# Patient Record
Sex: Female | Born: 1955 | Race: White | Hispanic: No | Marital: Married | Smoking: Former smoker
Health system: Southern US, Community
[De-identification: ages and names within clinical notes are randomized; demographics above are authoritative.]

## PROBLEM LIST (undated history)

## (undated) DIAGNOSIS — Z91030 Bee allergy status: Secondary | ICD-10-CM

## (undated) DIAGNOSIS — K219 Gastro-esophageal reflux disease without esophagitis: Secondary | ICD-10-CM

## (undated) DIAGNOSIS — G43909 Migraine, unspecified, not intractable, without status migrainosus: Secondary | ICD-10-CM

## (undated) DIAGNOSIS — K2270 Barrett's esophagus without dysplasia: Secondary | ICD-10-CM

## (undated) DIAGNOSIS — K5909 Other constipation: Secondary | ICD-10-CM

## (undated) DIAGNOSIS — K589 Irritable bowel syndrome without diarrhea: Secondary | ICD-10-CM

## (undated) DIAGNOSIS — F988 Other specified behavioral and emotional disorders with onset usually occurring in childhood and adolescence: Secondary | ICD-10-CM

## (undated) DIAGNOSIS — F32A Depression, unspecified: Secondary | ICD-10-CM

## (undated) DIAGNOSIS — D649 Anemia, unspecified: Secondary | ICD-10-CM

## (undated) DIAGNOSIS — Z221 Carrier of other intestinal infectious diseases: Secondary | ICD-10-CM

## (undated) DIAGNOSIS — M1990 Unspecified osteoarthritis, unspecified site: Secondary | ICD-10-CM

## (undated) DIAGNOSIS — F329 Major depressive disorder, single episode, unspecified: Secondary | ICD-10-CM

## (undated) DIAGNOSIS — A6000 Herpesviral infection of urogenital system, unspecified: Secondary | ICD-10-CM

## (undated) DIAGNOSIS — I10 Essential (primary) hypertension: Secondary | ICD-10-CM

## (undated) DIAGNOSIS — R5383 Other fatigue: Secondary | ICD-10-CM

## (undated) HISTORY — PX: PARTIAL HYSTERECTOMY: SHX80

## (undated) HISTORY — DX: Barrett's esophagus without dysplasia: K22.70

## (undated) HISTORY — DX: Herpesviral infection of urogenital system, unspecified: A60.00

## (undated) HISTORY — DX: Gastro-esophageal reflux disease without esophagitis: K21.9

## (undated) HISTORY — DX: Bee allergy status: Z91.030

## (undated) HISTORY — DX: Carrier of other intestinal infectious diseases: Z22.1

## (undated) HISTORY — DX: Other specified behavioral and emotional disorders with onset usually occurring in childhood and adolescence: F98.8

## (undated) HISTORY — DX: Migraine, unspecified, not intractable, without status migrainosus: G43.909

## (undated) HISTORY — DX: Anemia, unspecified: D64.9

## (undated) HISTORY — DX: Other constipation: K59.09

## (undated) HISTORY — DX: Depression, unspecified: F32.A

## (undated) HISTORY — DX: Other fatigue: R53.83

## (undated) HISTORY — DX: Essential (primary) hypertension: I10

## (undated) HISTORY — PX: HERNIA REPAIR: SHX51

## (undated) HISTORY — PX: ESOPHAGOGASTRODUODENOSCOPY ENDOSCOPY: SHX5814

## (undated) HISTORY — DX: Unspecified osteoarthritis, unspecified site: M19.90

## (undated) HISTORY — DX: Irritable bowel syndrome, unspecified: K58.9

---
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
## 1997-06-28 ENCOUNTER — Ambulatory Visit (HOSPITAL_COMMUNITY): Admission: RE | Admit: 1997-06-28 | Discharge: 1997-06-28 | Payer: Self-pay | Admitting: General Surgery

## 1997-11-22 ENCOUNTER — Other Ambulatory Visit: Admission: RE | Admit: 1997-11-22 | Discharge: 1997-11-22 | Payer: Self-pay | Admitting: Obstetrics and Gynecology

## 1997-12-06 ENCOUNTER — Inpatient Hospital Stay (HOSPITAL_COMMUNITY): Admission: RE | Admit: 1997-12-06 | Discharge: 1997-12-07 | Payer: Self-pay | Admitting: Obstetrics and Gynecology

## 1998-01-19 ENCOUNTER — Ambulatory Visit (HOSPITAL_COMMUNITY): Admission: RE | Admit: 1998-01-19 | Discharge: 1998-01-19 | Payer: Self-pay | Admitting: Obstetrics and Gynecology

## 1998-07-11 ENCOUNTER — Ambulatory Visit (HOSPITAL_COMMUNITY): Admission: RE | Admit: 1998-07-11 | Discharge: 1998-07-11 | Payer: Self-pay | Admitting: Obstetrics and Gynecology

## 1998-07-11 ENCOUNTER — Encounter: Payer: Self-pay | Admitting: Obstetrics and Gynecology

## 1998-12-28 ENCOUNTER — Other Ambulatory Visit: Admission: RE | Admit: 1998-12-28 | Discharge: 1998-12-28 | Payer: Self-pay | Admitting: Obstetrics and Gynecology

## 1999-06-07 ENCOUNTER — Encounter: Payer: Self-pay | Admitting: Obstetrics and Gynecology

## 1999-06-07 ENCOUNTER — Ambulatory Visit (HOSPITAL_COMMUNITY): Admission: RE | Admit: 1999-06-07 | Discharge: 1999-06-07 | Payer: Self-pay | Admitting: Obstetrics and Gynecology

## 2000-01-29 ENCOUNTER — Other Ambulatory Visit: Admission: RE | Admit: 2000-01-29 | Discharge: 2000-01-29 | Payer: Self-pay | Admitting: Obstetrics and Gynecology

## 2000-06-18 ENCOUNTER — Encounter: Payer: Self-pay | Admitting: Obstetrics and Gynecology

## 2000-06-18 ENCOUNTER — Ambulatory Visit (HOSPITAL_COMMUNITY): Admission: RE | Admit: 2000-06-18 | Discharge: 2000-06-18 | Payer: Self-pay | Admitting: Obstetrics and Gynecology

## 2001-02-24 ENCOUNTER — Other Ambulatory Visit: Admission: RE | Admit: 2001-02-24 | Discharge: 2001-02-24 | Payer: Self-pay | Admitting: Obstetrics and Gynecology

## 2002-05-12 ENCOUNTER — Ambulatory Visit (HOSPITAL_COMMUNITY): Admission: RE | Admit: 2002-05-12 | Discharge: 2002-05-12 | Payer: Self-pay | Admitting: Gastroenterology

## 2003-05-10 ENCOUNTER — Ambulatory Visit (HOSPITAL_COMMUNITY): Admission: RE | Admit: 2003-05-10 | Discharge: 2003-05-10 | Payer: Self-pay | Admitting: Family Medicine

## 2004-06-12 ENCOUNTER — Ambulatory Visit (HOSPITAL_COMMUNITY): Admission: RE | Admit: 2004-06-12 | Discharge: 2004-06-12 | Payer: Self-pay | Admitting: Family Medicine

## 2005-06-17 ENCOUNTER — Encounter: Admission: RE | Admit: 2005-06-17 | Discharge: 2005-06-17 | Payer: Self-pay | Admitting: Family Medicine

## 2006-07-15 ENCOUNTER — Encounter: Admission: RE | Admit: 2006-07-15 | Discharge: 2006-07-15 | Payer: Self-pay | Admitting: Family Medicine

## 2012-07-14 ENCOUNTER — Telehealth: Payer: Self-pay | Admitting: *Deleted

## 2012-07-14 NOTE — Telephone Encounter (Signed)
PT WANTED TO KNOW WHAT SHE SHOULD DO SHE NEEDS RX. REFILL, BUT WAS NOT SURE IF SHE NEEDED APPT FOR FU OR LABS. SAID ITS BEEN A WHILE. WANTED TO GET A FOR SURE FROM DR. ZANARD AS TO WHAT TO DO.

## 2012-07-15 NOTE — Telephone Encounter (Signed)
Yes she needs labwork.  You can bring her one of these mornings for labs and a week later to see Dr Claudia Mitchell.  Thanks PG

## 2012-07-17 NOTE — Telephone Encounter (Signed)
LAB APPT SCHED.

## 2012-07-17 NOTE — Addendum Note (Signed)
Addended by: Clint Bolder D on: 07/17/2012 05:13 PM   Modules accepted: Orders

## 2012-07-20 ENCOUNTER — Other Ambulatory Visit: Payer: Self-pay

## 2012-09-10 ENCOUNTER — Other Ambulatory Visit: Payer: Self-pay | Admitting: Family Medicine

## 2012-09-10 ENCOUNTER — Other Ambulatory Visit: Payer: Self-pay

## 2012-09-10 LAB — CBC WITH DIFFERENTIAL/PLATELET
Basophils Absolute: 0.1 10*3/uL (ref 0.0–0.1)
Basophils Relative: 1 % (ref 0–1)
Eosinophils Absolute: 0.5 10*3/uL (ref 0.0–0.7)
Eosinophils Relative: 10 % — ABNORMAL HIGH (ref 0–5)
HCT: 39.9 % (ref 36.0–46.0)
Hemoglobin: 13.2 g/dL (ref 12.0–15.0)
Lymphocytes Relative: 26 % (ref 12–46)
Lymphs Abs: 1.3 10*3/uL (ref 0.7–4.0)
MCH: 30.1 pg (ref 26.0–34.0)
MCHC: 33.1 g/dL (ref 30.0–36.0)
MCV: 90.9 fL (ref 78.0–100.0)
Monocytes Absolute: 0.5 10*3/uL (ref 0.1–1.0)
Monocytes Relative: 9 % (ref 3–12)
Neutro Abs: 2.8 10*3/uL (ref 1.7–7.7)
Neutrophils Relative %: 54 % (ref 43–77)
Platelets: 309 10*3/uL (ref 150–400)
RBC: 4.39 MIL/uL (ref 3.87–5.11)
RDW: 12.9 % (ref 11.5–15.5)
WBC: 5.1 10*3/uL (ref 4.0–10.5)

## 2012-09-10 LAB — COMPLETE METABOLIC PANEL WITH GFR
ALT: 16 U/L (ref 0–35)
AST: 17 U/L (ref 0–37)
Albumin: 4.2 g/dL (ref 3.5–5.2)
Alkaline Phosphatase: 36 U/L — ABNORMAL LOW (ref 39–117)
BUN: 17 mg/dL (ref 6–23)
CO2: 23 mEq/L (ref 19–32)
Calcium: 9.2 mg/dL (ref 8.4–10.5)
Chloride: 107 mEq/L (ref 96–112)
Creat: 0.96 mg/dL (ref 0.50–1.10)
GFR, Est African American: 76 mL/min
GFR, Est Non African American: 66 mL/min
Glucose, Bld: 102 mg/dL — ABNORMAL HIGH (ref 70–99)
Potassium: 4.2 mEq/L (ref 3.5–5.3)
Sodium: 139 mEq/L (ref 135–145)
Total Bilirubin: 0.3 mg/dL (ref 0.3–1.2)
Total Protein: 6.4 g/dL (ref 6.0–8.3)

## 2012-09-10 LAB — LIPID PANEL
Cholesterol: 186 mg/dL (ref 0–200)
HDL: 48 mg/dL (ref >39)
LDL Cholesterol: 122 mg/dL — ABNORMAL HIGH (ref 0–99)
Total CHOL/HDL Ratio: 3.9 Ratio
Triglycerides: 78 mg/dL (ref <150)
VLDL: 16 mg/dL (ref 0–40)

## 2012-09-10 LAB — TESTOSTERONE: Testosterone: 20 ng/dL (ref 10–70)

## 2012-09-10 LAB — URIC ACID: Uric Acid, Serum: 4 mg/dL (ref 2.4–7.0)

## 2012-09-10 LAB — ESTRADIOL: Estradiol: 65 pg/mL

## 2012-09-10 LAB — PROGESTERONE: Progesterone: 2.4 ng/mL

## 2012-09-11 LAB — VITAMIN D 25 HYDROXY (VIT D DEFICIENCY, FRACTURES): Vit D, 25-Hydroxy: 63 ng/mL (ref 30–89)

## 2012-09-17 ENCOUNTER — Telehealth: Payer: Self-pay | Admitting: Family Medicine

## 2012-09-17 ENCOUNTER — Encounter: Payer: Self-pay | Admitting: Family Medicine

## 2012-09-17 ENCOUNTER — Encounter: Payer: Self-pay | Admitting: *Deleted

## 2012-09-17 ENCOUNTER — Ambulatory Visit (INDEPENDENT_AMBULATORY_CARE_PROVIDER_SITE_OTHER): Admitting: Family Medicine

## 2012-09-17 VITALS — BP 116/69 | HR 69 | Ht 60.0 in | Wt 138.0 lb

## 2012-09-17 DIAGNOSIS — G43809 Other migraine, not intractable, without status migrainosus: Secondary | ICD-10-CM

## 2012-09-17 DIAGNOSIS — M542 Cervicalgia: Secondary | ICD-10-CM

## 2012-09-17 DIAGNOSIS — K219 Gastro-esophageal reflux disease without esophagitis: Secondary | ICD-10-CM

## 2012-09-17 DIAGNOSIS — N958 Other specified menopausal and perimenopausal disorders: Secondary | ICD-10-CM

## 2012-09-17 DIAGNOSIS — I10 Essential (primary) hypertension: Secondary | ICD-10-CM

## 2012-09-17 DIAGNOSIS — F411 Generalized anxiety disorder: Secondary | ICD-10-CM

## 2012-09-17 DIAGNOSIS — E785 Hyperlipidemia, unspecified: Secondary | ICD-10-CM

## 2012-09-17 MED ORDER — NEBIVOLOL HCL 5 MG PO TABS: 5.0000 mg | ORAL_TABLET | Freq: Every day | ORAL | Status: DC

## 2012-09-17 MED ORDER — ALMOTRIPTAN MALATE 12.5 MG PO TABS: 12.5000 mg | ORAL_TABLET | ORAL | Status: DC | PRN

## 2012-09-17 MED ORDER — PROGESTERONE MICRONIZED 100 MG PO CAPS: 100.0000 mg | ORAL_CAPSULE | Freq: Every day | ORAL | Status: DC

## 2012-09-17 MED ORDER — IBUPROFEN 600 MG PO TABS: 600.0000 mg | ORAL_TABLET | Freq: Three times a day (TID) | ORAL | Status: DC | PRN

## 2012-09-17 MED ORDER — FLUOXETINE HCL 20 MG PO CAPS: 20.0000 mg | ORAL_CAPSULE | Freq: Every day | ORAL | Status: DC

## 2012-09-17 MED ORDER — PANTOPRAZOLE SODIUM 40 MG PO TBEC: 40.0000 mg | DELAYED_RELEASE_TABLET | Freq: Every day | ORAL | Status: DC

## 2012-09-17 MED ORDER — BUPROPION HCL ER (XL) 300 MG PO TB24: 300.0000 mg | ORAL_TABLET | ORAL | Status: DC

## 2012-09-17 MED ORDER — TOPIRAMATE 100 MG PO TABS: 100.0000 mg | ORAL_TABLET | Freq: Every day | ORAL | Status: DC

## 2012-09-17 MED ORDER — MAGNESIUM GLUCONATE 500 MG PO TABS: 500.0000 mg | ORAL_TABLET | Freq: Every day | ORAL | Status: DC

## 2012-09-17 NOTE — Patient Instructions (Addendum)
1)  Blood Sugar - Your level was a little high so you want to watch your sugar/carb intake.  Increase your exercise.  Limit artificial sweeteners.  Cinnamon 1000 mg and Chromium 1000 mcg help with sugar issues (lower sugar, decrease insulin resistance).    2)  Headaches - Dr. Neale Burly ([facility])

## 2012-09-20 LAB — ESTRONE: Estrone: 27 pg/mL

## 2012-09-24 ENCOUNTER — Encounter: Payer: Self-pay | Admitting: Family Medicine

## 2012-09-24 DIAGNOSIS — I10 Essential (primary) hypertension: Secondary | ICD-10-CM | POA: Insufficient documentation

## 2012-09-24 DIAGNOSIS — K219 Gastro-esophageal reflux disease without esophagitis: Secondary | ICD-10-CM | POA: Insufficient documentation

## 2012-09-24 DIAGNOSIS — F411 Generalized anxiety disorder: Secondary | ICD-10-CM | POA: Insufficient documentation

## 2012-09-24 DIAGNOSIS — G43809 Other migraine, not intractable, without status migrainosus: Secondary | ICD-10-CM | POA: Insufficient documentation

## 2012-09-24 NOTE — Progress Notes (Signed)
  Subjective:    Patient ID: Claudia Mitchell, female    DOB: Sep 09, 1955, 57 y.o.   MRN: [id number]  HPI  Claudia Mitchell is here today to go over her most recent lab results, discuss the issues below and to get refills on many of her medications.    1)  Migraine Headaches:  She has been having increased migraine headaches lately.    2) Hypertension: Her BP is well controlled on the Bystolic.  3) GERD: She is taking and doing fine on Protonix.   4) Anxiety:  Her mood is fine on the combination of Wellbutrin and Prozac.    5) HRT:  Her menopausal symptoms are controlled with the Femring and Prometrium.    Review of Systems  Constitutional: Negative for activity change, fatigue and unexpected weight change.  Eyes: Negative.   Respiratory: Negative for shortness of breath.   Cardiovascular: Negative for chest pain, palpitations and leg swelling.  Gastrointestinal: Negative for diarrhea and constipation.  Endocrine: Negative.   Genitourinary: Negative for difficulty urinating.  Musculoskeletal: Negative.        Left neck/shoulder pain   Skin: Negative.   Neurological: Positive for headaches.  Hematological: Negative for adenopathy. Does not bruise/bleed easily.  Psychiatric/Behavioral: Negative for sleep disturbance and dysphoric mood. The patient is not nervous/anxious.     Past Medical History  Diagnosis Date  . Hypertension   . Migraine headache   . Chronic constipation   . Clostridium difficile carrier   . Anemia    Family History  Problem Relation Age of Onset  . Hypertension Mother   . Cancer Father    History   Social History Narrative   Marital Status: Married Ree Kida)   Children:  G 4 P 3013   Pets:  None    Living Situation: Lives with spouse and mother.     Occupation: Psychologist, clinical   Alcohol Use:  Occasional    Drug Use:  None   Tobacco Use:  Never    Diet:  Regular   Exercise:  Limited    Hobbies: Cooking      Objective:    Physical Exam  Constitutional: She appears well-nourished. No distress.  HENT:  Head: Normocephalic.  Eyes: No scleral icterus.  Neck: No thyromegaly present.  Tight muscles   Cardiovascular: Normal rate, regular rhythm and normal heart sounds.   Pulmonary/Chest: Effort normal and breath sounds normal.  Abdominal: There is no tenderness.  Musculoskeletal: She exhibits no edema and no tenderness.  Neurological: She is alert.  Skin: Skin is warm and dry.  Psychiatric: She has a normal mood and affect. Her behavior is normal. Judgment and thought content normal.      Assessment & Plan:

## 2012-10-05 ENCOUNTER — Ambulatory Visit (INDEPENDENT_AMBULATORY_CARE_PROVIDER_SITE_OTHER): Admitting: Family Medicine

## 2012-10-05 ENCOUNTER — Other Ambulatory Visit: Payer: Self-pay | Admitting: Family Medicine

## 2012-10-05 ENCOUNTER — Encounter: Payer: Self-pay | Admitting: Family Medicine

## 2012-10-05 ENCOUNTER — Ambulatory Visit
Admission: RE | Admit: 2012-10-05 | Discharge: 2012-10-05 | Disposition: A | Discharge status: Home / Self Care | Payer: 59 | Source: Ambulatory Visit | Attending: Family Medicine | Admitting: Family Medicine

## 2012-10-05 VITALS — BP 129/84 | HR 67 | Wt 138.0 lb

## 2012-10-05 DIAGNOSIS — M542 Cervicalgia: Secondary | ICD-10-CM | POA: Insufficient documentation

## 2012-10-05 DIAGNOSIS — N958 Other specified menopausal and perimenopausal disorders: Secondary | ICD-10-CM | POA: Insufficient documentation

## 2012-10-05 DIAGNOSIS — M79602 Pain in left arm: Secondary | ICD-10-CM

## 2012-10-05 DIAGNOSIS — E785 Hyperlipidemia, unspecified: Secondary | ICD-10-CM | POA: Insufficient documentation

## 2012-10-05 MED ORDER — KETOROLAC TROMETHAMINE 60 MG/2ML IM SOLN
60.0000 mg | Freq: Once | INTRAMUSCULAR | Status: AC
  Administered 2012-10-05: 60 mg via INTRAMUSCULAR

## 2012-10-05 MED ORDER — METHYLPREDNISOLONE (PAK) 4 MG PO TABS: 4.0000 mg | ORAL_TABLET | Freq: Every day | ORAL | Status: DC

## 2012-10-05 MED ORDER — METHYLPREDNISOLONE SODIUM SUCC 125 MG IJ SOLR
125.0000 mg | Freq: Once | INTRAMUSCULAR | Status: AC
  Administered 2012-10-05: 125 mg via INTRAMUSCULAR

## 2012-10-05 NOTE — Assessment & Plan Note (Signed)
Refilled her HRT.

## 2012-10-05 NOTE — Assessment & Plan Note (Signed)
She has been seeing her chiropractor.  Her pain seems to be getting slowly better.

## 2012-10-05 NOTE — Assessment & Plan Note (Signed)
We discussed possible treatments for her including Botox.  She really likes her mother's neurologist in [place] and will see if he does this procedure.  If so then she will follow up with him.

## 2012-10-05 NOTE — Assessment & Plan Note (Addendum)
Sent for a MRI of her cervical spine which shows moderate compression/impingement.  We'll send the report to a neurologist to see if he feels she can be treated with injections vs surgery.  She received injections of Solu-Medrol and Toradol at today's visit and will start on the Medrol Dose Pak tomorrow.

## 2012-10-05 NOTE — Assessment & Plan Note (Signed)
Refilled her Wellbutrin and Prozac.

## 2012-10-05 NOTE — Progress Notes (Signed)
Case # [id number]. Approval # [id number] for patient to have MRI. Good from 10/05/12 until 12/04/12. [facility] has been notified and given the information above at this time. LB

## 2012-10-05 NOTE — Patient Instructions (Signed)

## 2012-10-05 NOTE — Assessment & Plan Note (Signed)
She will remain on Bystolic.

## 2012-10-05 NOTE — Progress Notes (Signed)
  Subjective:    Patient ID: Claudia Mitchell, female    DOB: 03-10-1956, 57 y.o.   MRN: [id number]   Claudia Mitchell is here today with severe neck/shoulder pain.  She has been getting chiropractic adjustments which have helped her in the past but are not helping now. She would like to be referred to a neurologist.    Neck Pain  This is a chronic problem. The problem occurs constantly. The problem has been rapidly worsening. The pain is present in the left side. The quality of the pain is described as stabbing and aching. The pain is at a severity of 10/10. The pain is severe. The symptoms are aggravated by position and twisting. The pain is worse during the night. Associated symptoms include chest pain and headaches. Pertinent negatives include no fever. She has tried NSAIDs, acetaminophen, bed rest, ice and chiropractic manipulation for the symptoms. The treatment provided no relief.    Review of Systems  Constitutional: Negative for fever.  HENT: Positive for neck pain.   Respiratory: Negative for cough, chest tightness and shortness of breath.   Cardiovascular: Positive for chest pain.  Neurological: Positive for headaches.       Objective:   Physical Exam  Constitutional: She appears distressed.  Neck: Neck supple.  Cardiovascular: Normal rate, regular rhythm and normal heart sounds.   Pulmonary/Chest: Effort normal and breath sounds normal.  Musculoskeletal: Normal range of motion. She exhibits tenderness (Neck/Shoulder Pain ).  Neurological: She exhibits normal muscle tone. Coordination normal.  Skin: Skin is warm and dry. No rash noted.  Psychiatric: She has a normal mood and affect. Her behavior is normal. Judgment and thought content normal.          Assessment & Plan:

## 2012-10-05 NOTE — Assessment & Plan Note (Signed)
She will work harder on diet and exercise.

## 2012-10-05 NOTE — Assessment & Plan Note (Signed)
Refilled Protonix 

## 2012-10-05 NOTE — Progress Notes (Deleted)
                                                                                                                                                                                                                                              Subjective:    Patient ID: Claudia Mitchell, female    DOB: Aug 22, 1955, 57 y.o.   MRN: [id number]  HPI    Review of Systems     Objective:   Physical Exam        Assessment & Plan:

## 2012-10-12 NOTE — Telephone Encounter (Signed)
done

## 2012-11-23 ENCOUNTER — Ambulatory Visit (INDEPENDENT_AMBULATORY_CARE_PROVIDER_SITE_OTHER): Payer: 59 | Admitting: Family Medicine

## 2012-11-23 VITALS — BP 120/82 | HR 63 | Resp 16 | Ht 60.0 in | Wt 136.0 lb

## 2012-11-23 DIAGNOSIS — B009 Herpesviral infection, unspecified: Secondary | ICD-10-CM

## 2012-11-23 MED ORDER — CLOBETASOL PROPIONATE 0.05 % EX CREA: TOPICAL_CREAM | Freq: Two times a day (BID) | CUTANEOUS | Status: DC

## 2012-11-23 NOTE — Progress Notes (Signed)
  Subjective:    Patient ID: Claudia Mitchell, female    DOB: 1956/02/14, 57 y.o.   MRN: [id number]  HPI  [name] is here today complaining of vaginal irritation. She feels very raw and thinks she might have a blister.  Her skin has been itching and burning.     Review of Systems  Constitutional: Negative for fever.  Genitourinary: Positive for genital sores. Negative for vaginal discharge.       Vaginal itching, burning and blistering   Skin: Positive for rash.        Objective:   Physical Exam  Constitutional: She appears well-nourished.  Uncomfortable   Cardiovascular: Normal rate and regular rhythm.   Pulmonary/Chest: Effort normal and breath sounds normal.  Genitourinary: Vagina normal. No vaginal discharge found.  Genital lesion that looks like a herpes lesion   Neurological: She is alert.  Skin: Skin is warm.  Psychiatric: She has a normal mood and affect.       Assessment & Plan:

## 2012-11-23 NOTE — Patient Instructions (Addendum)
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  If you still itch then apply the clobetasol.  You can also cover with Flander's Buttock Ointment.      Genital Herpes Genital herpes is a sexually transmitted disease. This means that it is a disease passed by having sex with an infected person. There is no cure for genital herpes. The time between attacks can be months to years. The virus may live in a person but produce no problems (symptoms). This infection can be passed to a baby as it travels down the birth canal (vagina). In a newborn, this can cause central nervous system damage, eye damage, or even death. The virus that causes genital herpes is usually HSV-2 virus. The virus that causes oral herpes is usually HSV-1. The diagnosis (learning what is wrong) is made through culture results. SYMPTOMS  Usually symptoms of pain and itching begin a few days to a week after contact. It first appears as small blisters that progress to small painful ulcers which then scab over and heal after several days. It affects the outer genitalia, birth canal, cervix, penis, anal area, buttocks, and thighs. HOME CARE INSTRUCTIONS   Keep ulcerated areas dry and clean.  Take medications as directed. Antiviral medications can speed up healing. They will not prevent recurrences or cure this infection. These medications can also be taken for suppression if there are frequent recurrences.  While the infection is active, it is contagious. Avoid all sexual contact during active infections.  Condoms may help prevent spread of the herpes virus.  Practice safe sex.  Wash your hands thoroughly after touching the genital area.  Avoid touching your eyes after touching your genital area.  Inform your caregiver if you have had genital herpes and become pregnant. It is your responsibility to insure a safe outcome for your baby in this pregnancy.  Only take over-the-counter or prescription  medicines for pain, discomfort, or fever as directed by your caregiver. SEEK MEDICAL CARE IF:   You have a recurrence of this infection.  You do not respond to medications and are not improving.  You have new sources of pain or discharge which have changed from the original infection.  You have an oral temperature above 102 F (38.9 C).  You develop abdominal pain.  You develop eye pain or signs of eye infection. Document Released: 04/05/2000 Document Revised: 07/01/2011 Document Reviewed: 04/26/2009 [facility] Patient Information 2014 [place], [place]. Genital Herpes Genital herpes is a sexually transmitted disease. This means that it is a disease passed by having sex with an infected person. There is no cure for genital herpes. The time between attacks can be months to years. The virus may live in a person but produce no problems (symptoms). This infection can be passed to a baby as it travels down the birth canal (vagina). In a newborn, this can cause central nervous system damage, eye damage, or even death. The virus that causes genital herpes is usually HSV-2 virus. The virus that causes oral herpes is usually HSV-1. The diagnosis (learning what is wrong) is made through culture results. SYMPTOMS  Usually symptoms of pain and itching begin a few days to a week after contact. It first appears as small blisters that progress to small painful ulcers which then scab over and heal after several days. It affects the outer genitalia, birth canal, cervix, penis, anal area, buttocks, and thighs. HOME CARE INSTRUCTIONS   Keep  ulcerated areas dry and clean.  Take medications as directed. Antiviral medications can speed up healing. They will not prevent recurrences or cure this infection. These medications can also be taken for suppression if there are frequent recurrences.  While the infection is active, it is contagious. Avoid all sexual contact during active infections.  Condoms may help  prevent spread of the herpes virus.  Practice safe sex.  Wash your hands thoroughly after touching the genital area.  Avoid touching your eyes after touching your genital area.  Inform your caregiver if you have had genital herpes and become pregnant. It is your responsibility to insure a safe outcome for your baby in this pregnancy.  Only take over-the-counter or prescription medicines for pain, discomfort, or fever as directed by your caregiver. SEEK MEDICAL CARE IF:   You have a recurrence of this infection.  You do not respond to medications and are not improving.  You have new sources of pain or discharge which have changed from the original infection.  You have an oral temperature above 102 F (38.9 C).  You develop abdominal pain.  You develop eye pain or signs of eye infection. Document Released: 04/05/2000 Document Revised: 07/01/2011 Document Reviewed: 04/26/2009 [facility] Patient Information 2014 [place], [place]. Genital Herpes Genital herpes is a sexually transmitted disease. This means that it is a disease passed by having sex with an infected person. There is no cure for genital herpes. The time between attacks can be months to years. The virus may live in a person but produce no problems (symptoms). This infection can be passed to a baby as it travels down the birth canal (vagina). In a newborn, this can cause central nervous system damage, eye damage, or even death. The virus that causes genital herpes is usually HSV-2 virus. The virus that causes oral herpes is usually HSV-1. The diagnosis (learning what is wrong) is made through culture results. SYMPTOMS  Usually symptoms of pain and itching begin a few days to a week after contact. It first appears as small blisters that progress to small painful ulcers which then scab over and heal after several days. It affects the outer genitalia, birth canal, cervix, penis, anal area, buttocks, and thighs. HOME CARE INSTRUCTIONS    Keep ulcerated areas dry and clean.  Take medications as directed. Antiviral medications can speed up healing. They will not prevent recurrences or cure this infection. These medications can also be taken for suppression if there are frequent recurrences.  While the infection is active, it is contagious. Avoid all sexual contact during active infections.  Condoms may help prevent spread of the herpes virus.  Practice safe sex.  Wash your hands thoroughly after touching the genital area.  Avoid touching your eyes after touching your genital area.  Inform your caregiver if you have had genital herpes and become pregnant. It is your responsibility to insure a safe outcome for your baby in this pregnancy.  Only take over-the-counter or prescription medicines for pain, discomfort, or fever as directed by your caregiver. SEEK MEDICAL CARE IF:   You have a recurrence of this infection.  You do not respond to medications and are not improving.  You have new sources of pain or discharge which have changed from the original infection.  You have an oral temperature above 102 F (38.9 C).  You develop abdominal pain.  You develop eye pain or signs of eye infection. Document Released: 04/05/2000 Document Revised: 07/01/2011 Document Reviewed: 04/26/2009 [employer] Patient Information 2014 [place]  [place]. Genital Herpes Genital herpes is a sexually transmitted disease. This means that it is a disease passed by having sex with an infected person. There is no cure for genital herpes. The time between attacks can be months to years. The virus may live in a person but produce no problems (symptoms). This infection can be passed to a baby as it travels down the birth canal (vagina). In a newborn, this can cause central nervous system damage, eye damage, or even death. The virus that causes genital herpes is usually HSV-2 virus. The virus that causes oral herpes is usually HSV-1. The diagnosis  (learning what is wrong) is made through culture results. SYMPTOMS  Usually symptoms of pain and itching begin a few days to a week after contact. It first appears as small blisters that progress to small painful ulcers which then scab over and heal after several days. It affects the outer genitalia, birth canal, cervix, penis, anal area, buttocks, and thighs. HOME CARE INSTRUCTIONS   Keep ulcerated areas dry and clean.  Take medications as directed. Antiviral medications can speed up healing. They will not prevent recurrences or cure this infection. These medications can also be taken for suppression if there are frequent recurrences.  While the infection is active, it is contagious. Avoid all sexual contact during active infections.  Condoms may help prevent spread of the herpes virus.  Practice safe sex.  Wash your hands thoroughly after touching the genital area.  Avoid touching your eyes after touching your genital area.  Inform your caregiver if you have had genital herpes and become pregnant. It is your responsibility to insure a safe outcome for your baby in this pregnancy.  Only take over-the-counter or prescription medicines for pain, discomfort, or fever as directed by your caregiver. SEEK MEDICAL CARE IF:   You have a recurrence of this infection.  You do not respond to medications and are not improving.  You have new sources of pain or discharge which have changed from the original infection.  You have an oral temperature above 102 F (38.9 C).  You develop abdominal pain.  You develop eye pain or signs of eye infection. Document Released: 04/05/2000 Document Revised: 07/01/2011 Document Reviewed: 04/26/2009 [facility] Patient Information 2014 [place], [place]. Genital Herpes Genital herpes is a sexually transmitted disease. This means that it is a disease passed by having sex with an infected person. There is no cure for genital herpes. The time between attacks can  be months to years. The virus may live in a person but produce no problems (symptoms). This infection can be passed to a baby as it travels down the birth canal (vagina). In a newborn, this can cause central nervous system damage, eye damage, or even death. The virus that causes genital herpes is usually HSV-2 virus. The virus that causes oral herpes is usually HSV-1. The diagnosis (learning what is wrong) is made through culture results. SYMPTOMS  Usually symptoms of pain and itching begin a few days to a week after contact. It first appears as small blisters that progress to small painful ulcers which then scab over and heal after several days. It affects the outer genitalia, birth canal, cervix, penis, anal area, buttocks, and thighs. HOME CARE INSTRUCTIONS   Keep ulcerated areas dry and clean.  Take medications as directed. Antiviral medications can speed up healing. They will not prevent recurrences or cure this infection. These medications can also be taken for suppression if there are frequent recurrences.  While the  infection is active, it is contagious. Avoid all sexual contact during active infections.  Condoms may help prevent spread of the herpes virus.  Practice safe sex.  Wash your hands thoroughly after touching the genital area.  Avoid touching your eyes after touching your genital area.  Inform your caregiver if you have had genital herpes and become pregnant. It is your responsibility to insure a safe outcome for your baby in this pregnancy.  Only take over-the-counter or prescription medicines for pain, discomfort, or fever as directed by your caregiver. SEEK MEDICAL CARE IF:   You have a recurrence of this infection.  You do not respond to medications and are not improving.  You have new sources of pain or discharge which have changed from the original infection.  You have an oral temperature above 102 F (38.9 C).  You develop abdominal pain.  You develop eye  pain or signs of eye infection. Document Released: 04/05/2000 Document Revised: 07/01/2011 Document Reviewed: 04/26/2009 [facility] Patient Information 2014 [place], [place]. Genital Herpes Genital herpes is a sexually transmitted disease. This means that it is a disease passed by having sex with an infected person. There is no cure for genital herpes. The time between attacks can be months to years. The virus may live in a person but produce no problems (symptoms). This infection can be passed to a baby as it travels down the birth canal (vagina). In a newborn, this can cause central nervous system damage, eye damage, or even death. The virus that causes genital herpes is usually HSV-2 virus. The virus that causes oral herpes is usually HSV-1. The diagnosis (learning what is wrong) is made through culture results. SYMPTOMS  Usually symptoms of pain and itching begin a few days to a week after contact. It first appears as small blisters that progress to small painful ulcers which then scab over and heal after several days. It affects the outer genitalia, birth canal, cervix, penis, anal area, buttocks, and thighs. HOME CARE INSTRUCTIONS   Keep ulcerated areas dry and clean.  Take medications as directed. Antiviral medications can speed up healing. They will not prevent recurrences or cure this infection. These medications can also be taken for suppression if there are frequent recurrences.  While the infection is active, it is contagious. Avoid all sexual contact during active infections.  Condoms may help prevent spread of the herpes virus.  Practice safe sex.  Wash your hands thoroughly after touching the genital area.  Avoid touching your eyes after touching your genital area.  Inform your caregiver if you have had genital herpes and become pregnant. It is your responsibility to insure a safe outcome for your baby in this pregnancy.  Only take over-the-counter or prescription medicines for  pain, discomfort, or fever as directed by your caregiver. SEEK MEDICAL CARE IF:   You have a recurrence of this infection.  You do not respond to medications and are not improving.  You have new sources of pain or discharge which have changed from the original infection.  You have an oral temperature above 102 F (38.9 C).  You develop abdominal pain.  You develop eye pain or signs of eye infection. Document Released: 04/05/2000 Document Revised: 07/01/2011 Document Reviewed: 04/26/2009 [facility] Patient Information 2014 [place], [place]. Genital Herpes Genital herpes is a sexually transmitted disease. This means that it is a disease passed by having sex with an infected person. There is no cure for genital herpes. The time between attacks can be months to years.  The virus may live in a person but produce no problems (symptoms). This infection can be passed to a baby as it travels down the birth canal (vagina). In a newborn, this can cause central nervous system damage, eye damage, or even death. The virus that causes genital herpes is usually HSV-2 virus. The virus that causes oral herpes is usually HSV-1. The diagnosis (learning what is wrong) is made through culture results. SYMPTOMS  Usually symptoms of pain and itching begin a few days to a week after contact. It first appears as small blisters that progress to small painful ulcers which then scab over and heal after several days. It affects the outer genitalia, birth canal, cervix, penis, anal area, buttocks, and thighs. HOME CARE INSTRUCTIONS   Keep ulcerated areas dry and clean.  Take medications as directed. Antiviral medications can speed up healing. They will not prevent recurrences or cure this infection. These medications can also be taken for suppression if there are frequent recurrences.  While the infection is active, it is contagious. Avoid all sexual contact during active infections.  Condoms may help prevent spread of  the herpes virus.  Practice safe sex.  Wash your hands thoroughly after touching the genital area.  Avoid touching your eyes after touching your genital area.  Inform your caregiver if you have had genital herpes and become pregnant. It is your responsibility to insure a safe outcome for your baby in this pregnancy.  Only take over-the-counter or prescription medicines for pain, discomfort, or fever as directed by your caregiver. SEEK MEDICAL CARE IF:   You have a recurrence of this infection.  You do not respond to medications and are not improving.  You have new sources of pain or discharge which have changed from the original infection.  You have an oral temperature above 102 F (38.9 C).  You develop abdominal pain.  You develop eye pain or signs of eye infection. Document Released: 04/05/2000 Document Revised: 07/01/2011 Document Reviewed: 04/26/2009 [facility] Patient Information 2014 [place], [place]. Genital Herpes Genital herpes is a sexually transmitted disease. This means that it is a disease passed by having sex with an infected person. There is no cure for genital herpes. The time between attacks can be months to years. The virus may live in a person but produce no problems (symptoms). This infection can be passed to a baby as it travels down the birth canal (vagina). In a newborn, this can cause central nervous system damage, eye damage, or even death. The virus that causes genital herpes is usually HSV-2 virus. The virus that causes oral herpes is usually HSV-1. The diagnosis (learning what is wrong) is made through culture results. SYMPTOMS  Usually symptoms of pain and itching begin a few days to a week after contact. It first appears as small blisters that progress to small painful ulcers which then scab over and heal after several days. It affects the outer genitalia, birth canal, cervix, penis, anal area, buttocks, and thighs. HOME CARE INSTRUCTIONS   Keep ulcerated  areas dry and clean.  Take medications as directed. Antiviral medications can speed up healing. They will not prevent recurrences or cure this infection. These medications can also be taken for suppression if there are frequent recurrences.  While the infection is active, it is contagious. Avoid all sexual contact during active infections.  Condoms may help prevent spread of the herpes virus.  Practice safe sex.  Wash your hands thoroughly after touching the genital area.  Avoid touching your  eyes after touching your genital area.  Inform your caregiver if you have had genital herpes and become pregnant. It is your responsibility to insure a safe outcome for your baby in this pregnancy.  Only take over-the-counter or prescription medicines for pain, discomfort, or fever as directed by your caregiver. SEEK MEDICAL CARE IF:   You have a recurrence of this infection.  You do not respond to medications and are not improving.  You have new sources of pain or discharge which have changed from the original infection.  You have an oral temp                                                                                                      erature above 102 F (38.9 C).  You develop abdominal pain.  You develop eye pain or signs of eye infection. Document Released: 04/05/2000 Document Revised: 07/01/2011 Document Reviewed: 04/26/2009 [facility] Patient Information 2014 [place], [place].

## 2013-01-09 ENCOUNTER — Encounter: Payer: Self-pay | Admitting: Family Medicine

## 2013-01-09 DIAGNOSIS — B009 Herpesviral infection, unspecified: Secondary | ICD-10-CM | POA: Insufficient documentation

## 2013-01-09 NOTE — Assessment & Plan Note (Signed)
Claudia Mitchell had a HSV lesion in the past.  She was given medications to treat her lesion.

## 2013-01-27 ENCOUNTER — Encounter: Payer: Self-pay | Admitting: *Deleted

## 2013-02-02 ENCOUNTER — Encounter: Payer: Self-pay | Admitting: Family Medicine

## 2013-02-02 ENCOUNTER — Ambulatory Visit (INDEPENDENT_AMBULATORY_CARE_PROVIDER_SITE_OTHER): Payer: Commercial Indemnity | Admitting: Family Medicine

## 2013-02-02 VITALS — BP 123/77 | HR 73 | Resp 16 | Wt 136.0 lb

## 2013-02-02 DIAGNOSIS — Z23 Encounter for immunization: Secondary | ICD-10-CM

## 2013-02-02 DIAGNOSIS — Z780 Asymptomatic menopausal state: Secondary | ICD-10-CM

## 2013-02-02 DIAGNOSIS — I10 Essential (primary) hypertension: Secondary | ICD-10-CM

## 2013-02-02 DIAGNOSIS — F3289 Other specified depressive episodes: Secondary | ICD-10-CM

## 2013-02-02 DIAGNOSIS — F411 Generalized anxiety disorder: Secondary | ICD-10-CM

## 2013-02-02 DIAGNOSIS — N951 Menopausal and female climacteric states: Secondary | ICD-10-CM

## 2013-02-02 DIAGNOSIS — F329 Major depressive disorder, single episode, unspecified: Secondary | ICD-10-CM

## 2013-02-02 MED ORDER — WELLBUTRIN XL 300 MG PO TB24: 300.0000 mg | ORAL_TABLET | ORAL | Status: DC

## 2013-02-02 MED ORDER — HYDROXYZINE PAMOATE 25 MG PO CAPS: ORAL_CAPSULE | ORAL | Status: DC

## 2013-02-02 MED ORDER — FLUOXETINE HCL 20 MG PO CAPS: 20.0000 mg | ORAL_CAPSULE | Freq: Every day | ORAL | Status: DC

## 2013-02-02 MED ORDER — DIAZEPAM 5 MG PO TABS: ORAL_TABLET | ORAL | Status: DC

## 2013-02-02 MED ORDER — ESTRADIOL ACETATE 0.1 MG/24HR VA RING: 1.0000 | VAGINAL_RING | VAGINAL | Status: DC

## 2013-02-02 MED ORDER — PROGESTERONE MICRONIZED 100 MG PO CAPS: 100.0000 mg | ORAL_CAPSULE | Freq: Every day | ORAL | Status: DC

## 2013-02-02 MED ORDER — BUPROPION HCL ER (XL) 300 MG PO TB24: 300.0000 mg | ORAL_TABLET | ORAL | Status: DC

## 2013-02-02 MED ORDER — NEBIVOLOL HCL 5 MG PO TABS: 5.0000 mg | ORAL_TABLET | Freq: Every day | ORAL | Status: DC

## 2013-02-02 NOTE — Patient Instructions (Signed)
1)  Mood - If you can get the name brand Wellbutrin XL for $4, you may want to try it.  Try YOGA for mood if you need additional help.      Grief Reaction Grief is a normal response to the death of someone close to you. Feelings of fear, anger, and guilt can affect almost everyone who loses someone they love. Symptoms of depression are also common. These include problems with sleep, loss of appetite, and lack of energy. These grief reaction symptoms often last for weeks to months after a loss. They may also return during special times that remind you of the person you lost, such as an anniversary or birthday. Anxiety, insomnia, irritability, and deep depression may last beyond the period of normal grief. If you experience these feelings for 6 months or longer, you may have clinical depression. Clinical depression requires further medical attention. If you think that you have clinical depression, you should contact your caregiver. If you have a history of depression and or a family history of depression, you are at greater risk of clinical depression. You are also at greater risk of developing clinical depression if the loss was traumatic or the loss was of someone with whom you had unresolved issues.  A grief reaction can become complicated by being blocked. This means being unable to cry or express extreme emotions. This may prolong the grieving period and worsen the emotional effects of the loss. Mourning is a natural event in human life. A healthy grief reaction is one that is not blocked . It requires a time of sadness and readjustment.It is very important to share your sorrow and fear with others, especially close friends and family. Professional counselors and clergy can also help you process your grief. Document Released: 04/08/2005 Document Revised: 07/01/2011 Document Reviewed: 12/17/2005 [facility] Patient Information 2014 [place], [place].

## 2013-02-02 NOTE — Progress Notes (Signed)
  Subjective:    Patient ID: Claudia Mitchell, female    DOB: 1956/02/21, 57 y.o.   MRN: [id number]  HPI  Claudia Mitchell is here today to get medication refills and she is wanting to get a flu shot.  1) Anxiety: She is needing refills on her Prozac and Wellbutrin.  2) Postmenopausal: She is needing a refill on her Femring and her Progesterone.   3)  Hypertension:  Her BP is controlled on Bystolic.     Review of Systems  Constitutional: Negative.   HENT: Negative.   Eyes: Negative.   Respiratory: Negative.   Cardiovascular: Negative.   Gastrointestinal: Negative.   Endocrine: Negative.   Genitourinary: Negative.   Musculoskeletal: Negative.   Skin: Negative.   Allergic/Immunologic: Negative.   Hematological: Negative.   Psychiatric/Behavioral: Negative.      Past Medical History  Diagnosis Date  . Hypertension   . Migraine headache   . Chronic constipation   . Clostridium difficile carrier   . Anemia   . Genital herpes      Family History  Problem Relation Age of Onset  . Hypertension Mother   . GER disease Mother   . Cancer Father     Lung     History   Social History Narrative   Marital Status: Married Ree Kida)   Children:  G 4 P 3013   Pets:  None    Living Situation: Lives with spouse and mother.     Occupation: Psychologist, clinical   Alcohol Use:  Occasional    Drug Use:  None   Tobacco Use:  Never    Diet:  Regular   Exercise:  Limited    Hobbies: Cooking      Objective:   Physical Exam  Nursing note and vitals reviewed. Constitutional: She is oriented to person, place, and time. She appears well-developed and well-nourished.  HENT:  Head: Normocephalic.  Eyes: Pupils are equal, round, and reactive to light.  Neck: Normal range of motion.  Cardiovascular: Normal rate.   Pulmonary/Chest: Effort normal.  Abdominal: Soft.  Musculoskeletal: Normal range of motion.  Neurological: She is alert and oriented to person,  place, and time.  Skin: Skin is warm and dry.  Psychiatric: She has a normal mood and affect. Her behavior is normal. Judgment and thought content normal.      Assessment & Plan:    Jennell was seen today for medication management.  Diagnoses and associated orders for this visit:  Essential hypertension, benign - nebivolol (BYSTOLIC) 5 MG tablet; Take 1 tablet (5 mg total) by mouth daily.  Menopause - progesterone (PROMETRIUM) 100 MG capsule; Take 1 capsule (100 mg total) by mouth at bedtime. - Estradiol Acetate (FEMRING) 0.1 MG/24HR RING; Place 1 each vaginally every 3 (three) months.  Depressive disorder, not elsewhere classified - buPROPion (WELLBUTRIN XL) 300 MG 24 hr tablet; Take 1 tablet (300 mg total) by mouth every morning. - FLUoxetine (PROZAC) 20 MG capsule; Take 1 capsule (20 mg total) by mouth daily.  Anxiety state, unspecified - hydrOXYzine (VISTARIL) 25 MG capsule; Take up to 2 per day for increased anxiety - diazepam (VALIUM) 5 MG tablet; Take 1/2 to 1 tab as needed for increased stress  Need for prophylactic vaccination and inoculation against influenza - Flu Vaccine QUAD 36+ mos PF IM (Fluarix)

## 2013-03-28 DIAGNOSIS — Z23 Encounter for immunization: Secondary | ICD-10-CM | POA: Insufficient documentation

## 2013-03-28 NOTE — Assessment & Plan Note (Signed)
The patient confirmed that they are not allergic to eggs and have never had a bad reaction with the flu shot in the past.  The vaccination was given without difficulty.   

## 2013-04-19 DIAGNOSIS — F329 Major depressive disorder, single episode, unspecified: Secondary | ICD-10-CM | POA: Insufficient documentation

## 2013-04-19 DIAGNOSIS — Z780 Asymptomatic menopausal state: Secondary | ICD-10-CM | POA: Insufficient documentation

## 2013-04-19 DIAGNOSIS — F3289 Other specified depressive episodes: Secondary | ICD-10-CM | POA: Insufficient documentation

## 2013-07-15 ENCOUNTER — Encounter: Payer: Self-pay | Admitting: Family Medicine

## 2013-07-15 ENCOUNTER — Ambulatory Visit (INDEPENDENT_AMBULATORY_CARE_PROVIDER_SITE_OTHER): Payer: Commercial Indemnity | Admitting: Family Medicine

## 2013-07-15 VITALS — BP 123/76 | HR 67 | Wt 140.0 lb

## 2013-07-15 DIAGNOSIS — N898 Other specified noninflammatory disorders of vagina: Secondary | ICD-10-CM

## 2013-07-15 DIAGNOSIS — N949 Unspecified condition associated with female genital organs and menstrual cycle: Secondary | ICD-10-CM

## 2013-07-15 DIAGNOSIS — R102 Pelvic and perineal pain: Secondary | ICD-10-CM

## 2013-07-15 MED ORDER — FAMCICLOVIR 500 MG PO TABS: 1500.0000 mg | ORAL_TABLET | Freq: Three times a day (TID) | ORAL | Status: AC

## 2013-07-15 NOTE — Progress Notes (Signed)
   Subjective:    Patient ID: VAISHALI BAISE, female    DOB: 07/16/1955, 58 y.o.   MRN: [id number]  HPI  [name] is here today complaining of pain in her bottom (between her rectum and vagina).  She describes her discomfort as being severe in nature and she feels that she is worsening.  She also says that it feels that she has a lot of presssure in this area and it feels that her "organs are coming out - prolasping".  She takes Ibuprofen 600 mg which relieves her pain.       Review of Systems  Genitourinary: Positive for pelvic pain and dyspareunia. Negative for dysuria, vaginal bleeding, vaginal discharge and genital sores.       Objective:   Physical Exam  Genitourinary: Vagina normal.    There is no rash or lesion on the right labia. There is no rash or lesion on the left labia.      Assessment & Plan:    Jannatul was seen today for rectal pain.  Diagnoses and associated orders for this visit:  Vaginal lesion - famciclovir (FAMVIR) 500 MG tablet; Take 3 tablets (1,500 mg total) by mouth 3 (three) times daily. At onset of symptoms x 1 day - HSV(herpes simplex vrs) 1+2 ab-IgG

## 2013-07-16 LAB — HSV(HERPES SIMPLEX VRS) I + II AB-IGG
HSV 1 Glycoprotein G Ab, IgG: <0.1 IV
HSV 2 Glycoprotein G Ab, IgG: 4.39 IV — ABNORMAL HIGH

## 2013-07-19 ENCOUNTER — Encounter: Payer: Self-pay | Admitting: Family Medicine

## 2013-07-21 ENCOUNTER — Encounter: Payer: Self-pay | Admitting: Family Medicine

## 2013-07-21 ENCOUNTER — Other Ambulatory Visit: Payer: Self-pay | Admitting: *Deleted

## 2013-07-21 ENCOUNTER — Ambulatory Visit (INDEPENDENT_AMBULATORY_CARE_PROVIDER_SITE_OTHER): Payer: Commercial Indemnity | Admitting: Family Medicine

## 2013-07-21 VITALS — BP 126/72 | HR 65 | Resp 16 | Wt 139.0 lb

## 2013-07-21 DIAGNOSIS — R5383 Other fatigue: Secondary | ICD-10-CM

## 2013-07-21 DIAGNOSIS — R102 Pelvic and perineal pain: Secondary | ICD-10-CM

## 2013-07-21 DIAGNOSIS — R5381 Other malaise: Secondary | ICD-10-CM

## 2013-07-21 DIAGNOSIS — E785 Hyperlipidemia, unspecified: Secondary | ICD-10-CM

## 2013-07-21 DIAGNOSIS — M25569 Pain in unspecified knee: Secondary | ICD-10-CM

## 2013-07-21 DIAGNOSIS — N949 Unspecified condition associated with female genital organs and menstrual cycle: Secondary | ICD-10-CM

## 2013-07-21 LAB — COMPLETE METABOLIC PANEL WITH GFR
ALT: 14 U/L (ref 0–35)
AST: 17 U/L (ref 0–37)
Albumin: 4.5 g/dL (ref 3.5–5.2)
Alkaline Phosphatase: 32 U/L — ABNORMAL LOW (ref 39–117)
BUN: 14 mg/dL (ref 6–23)
CO2: 26 mEq/L (ref 19–32)
Calcium: 9.3 mg/dL (ref 8.4–10.5)
Chloride: 109 mEq/L (ref 96–112)
Creat: 0.87 mg/dL (ref 0.50–1.10)
GFR, Est African American: 86 mL/min
GFR, Est Non African American: 74 mL/min
Glucose, Bld: 98 mg/dL (ref 70–99)
Potassium: 4.6 mEq/L (ref 3.5–5.3)
Sodium: 141 mEq/L (ref 135–145)
Total Bilirubin: 0.5 mg/dL (ref 0.2–1.2)
Total Protein: 6.7 g/dL (ref 6.0–8.3)

## 2013-07-21 LAB — CBC WITH DIFFERENTIAL/PLATELET
Basophils Absolute: 0 10*3/uL (ref 0.0–0.1)
Basophils Relative: 1 % (ref 0–1)
Eosinophils Absolute: 0.1 10*3/uL (ref 0.0–0.7)
Eosinophils Relative: 3 % (ref 0–5)
HCT: 39.5 % (ref 36.0–46.0)
Hemoglobin: 13.6 g/dL (ref 12.0–15.0)
Lymphocytes Relative: 30 % (ref 12–46)
Lymphs Abs: 1.4 10*3/uL (ref 0.7–4.0)
MCH: 30.4 pg (ref 26.0–34.0)
MCHC: 34.4 g/dL (ref 30.0–36.0)
MCV: 88.2 fL (ref 78.0–100.0)
Monocytes Absolute: 0.4 10*3/uL (ref 0.1–1.0)
Monocytes Relative: 8 % (ref 3–12)
Neutro Abs: 2.7 10*3/uL (ref 1.7–7.7)
Neutrophils Relative %: 58 % (ref 43–77)
Platelets: 312 10*3/uL (ref 150–400)
RBC: 4.48 MIL/uL (ref 3.87–5.11)
RDW: 13.1 % (ref 11.5–15.5)
WBC: 4.6 10*3/uL (ref 4.0–10.5)

## 2013-07-21 LAB — LIPID PANEL
Cholesterol: 187 mg/dL (ref 0–200)
HDL: 53 mg/dL (ref >39)
LDL Cholesterol: 115 mg/dL — ABNORMAL HIGH (ref 0–99)
Total CHOL/HDL Ratio: 3.5 Ratio
Triglycerides: 93 mg/dL (ref <150)
VLDL: 19 mg/dL (ref 0–40)

## 2013-07-21 MED ORDER — METHYLPREDNISOLONE SODIUM SUCC 125 MG IJ SOLR
125.0000 mg | Freq: Once | INTRAMUSCULAR | Status: AC
Start: 2013-07-21 — End: 2013-07-21
  Administered 2013-07-21: 125 mg via INTRAMUSCULAR

## 2013-07-21 MED ORDER — KETOROLAC TROMETHAMINE 60 MG/2ML IM SOLN
60.0000 mg | Freq: Once | INTRAMUSCULAR | Status: AC
  Administered 2013-07-21: 60 mg via INTRAMUSCULAR

## 2013-07-21 MED ORDER — PREGABALIN 50 MG PO CAPS: 50.0000 mg | ORAL_CAPSULE | Freq: Three times a day (TID) | ORAL | Status: DC

## 2013-07-21 NOTE — Patient Instructions (Addendum)
1)  Perineal/Leg Pain - Take the Famvir 1 pill 3 x per day for 7 day along with the Lyrica.  Start with 50 mg at night and slowly increase to 3 per day.  If the pain/pressure does not improve, I would recommend that you follow up with Dr. Ophelia Charter to see what he thinks.     Shingles Shingles (herpes zoster) is an infection that is caused by the same virus that causes chickenpox (varicella). The infection causes a painful skin rash and fluid-filled blisters, which eventually break open, crust over, and heal. It may occur in any area of the body, but it usually affects only one side of the body or face. The pain of shingles usually lasts about 1 month. However, some people with shingles may develop long-term (chronic) pain in the affected area of the body. Shingles often occurs many years after the person had chickenpox. It is more common:  In people older than 50 years.  In people with weakened immune systems, such as those with HIV, AIDS, or cancer.  In people taking medicines that weaken the immune system, such as transplant medicines.  In people under great stress. CAUSES  Shingles is caused by the varicella zoster virus (VZV), which also causes chickenpox. After a person is infected with the virus, it can remain in the person's body for years in an inactive state (dormant). To cause shingles, the virus reactivates and breaks out as an infection in a nerve root. The virus can be spread from person to person (contagious) through contact with open blisters of the shingles rash. It will only spread to people who have not had chickenpox. When these people are exposed to the virus, they may develop chickenpox. They will not develop shingles. Once the blisters scab over, the person is no longer contagious and cannot spread the virus to others. SYMPTOMS  Shingles shows up in stages. The initial symptoms may be pain, itching, and tingling in an area of the skin. This pain is usually described as burning,  stabbing, or throbbing.In a few days or weeks, a painful red rash will appear in the area where the pain, itching, and tingling were felt. The rash is usually on one side of the body in a band or belt-like pattern. Then, the rash usually turns into fluid-filled blisters. They will scab over and dry up in approximately 2 3 weeks. Flu-like symptoms may also occur with the initial symptoms, the rash, or the blisters. These may include:  Fever.  Chills.  Headache.  Upset stomach. DIAGNOSIS  Your caregiver will perform a skin exam to diagnose shingles. Skin scrapings or fluid samples may also be taken from the blisters. This sample will be examined under a microscope or sent to a lab for further testing. TREATMENT  There is no specific cure for shingles. Your caregiver will likely prescribe medicines to help you manage the pain, recover faster, and avoid long-term problems. This may include antiviral drugs, anti-inflammatory drugs, and pain medicines. HOME CARE INSTRUCTIONS   Take a cool bath or apply cool compresses to the area of the rash or blisters as directed. This may help with the pain and itching.   Only take over-the-counter or prescription medicines as directed by your caregiver.   Rest as directed by your caregiver.  Keep your rash and blisters clean with mild soap and cool water or as directed by your caregiver.  Do not pick your blisters or scratch your rash. Apply an anti-itch cream or numbing creams  to the affected area as directed by your caregiver.  Keep your shingles rash covered with a loose bandage (dressing).  Avoid skin contact with:  Babies.   Pregnant women.   Children with eczema.   Elderly people with transplants.   People with chronic illnesses, such as leukemia or AIDS.   Wear loose-fitting clothing to help ease the pain of material rubbing against the rash.  Keep all follow-up appointments with your caregiver.If the area involved is on your  face, you may receive a referral for follow-up to a specialist, such as an eye doctor (ophthalmologist) or an ear, nose, and throat (ENT) doctor. Keeping all follow-up appointments will help you avoid eye complications, chronic pain, or disability.  SEEK IMMEDIATE MEDICAL CARE IF:   You have facial pain, pain around the eye area, or loss of feeling on one side of your face.  You have ear pain or ringing in your ear.  You have loss of taste.  Your pain is not relieved with prescribed medicines.   Your redness or swelling spreads.   You have more pain and swelling.  Your condition is worsening or has changed.   You have a feveror persistent symptoms for more than 2 3 days.  You have a fever and your symptoms suddenly get worse. MAKE SURE YOU:  Understand these instructions.  Will watch your condition.  Will get help right away if you are not doing well or get worse. Document Released: 04/08/2005 Document Revised: 01/01/2012 Document Reviewed: 11/21/2011 [facility] Patient Information 2014 [place].

## 2013-07-21 NOTE — Progress Notes (Signed)
Subjective:    Patient ID: Claudia Mitchell, female    DOB: [birth year], 58 y.o.   MRN: [id number]  HPI  Claudia Mitchell is back to day for a recheck of her perineal pain.  She feels that the pain improved temporarily when she took Valtrex (three pills on Friday and Saturday) and then Famciclovir (three pills on Monday).  She has not taken either pills and feels that her pain and discomfort are worsening again.  She feels that this pain is concentrated in the right side of her groin/back area.    Review of Systems  Constitutional: Negative for activity change, fatigue and unexpected weight change.  HENT: Negative.   Eyes: Negative.   Respiratory: Negative for shortness of breath.   Cardiovascular: Negative for chest pain, palpitations and leg swelling.  Gastrointestinal: Negative for diarrhea and constipation.  Endocrine: Negative.   Genitourinary: Positive for vaginal pain and pelvic pain. Negative for difficulty urinating.  Musculoskeletal: Positive for back pain.  Skin: Negative.  Negative for rash.  Neurological: Negative.   Hematological: Negative for adenopathy. Does not bruise/bleed easily.  Psychiatric/Behavioral: Negative for sleep disturbance and dysphoric mood. The patient is not nervous/anxious.      Past Medical History  Diagnosis Date  . Hypertension   . Migraine headache   . Chronic constipation   . Clostridium difficile carrier   . Anemia   . Genital herpes      Past Surgical History  Procedure Laterality Date  . Partial hysterectomy       History   Social History Narrative   Marital Status: Married Barnabas Lister)   Children:  G 4 P 3013   Pets:  None    Living Situation: Lives with spouse and mother.     Occupation: Art gallery manager   Alcohol Use:  Occasional    Drug Use:  None   Tobacco Use:  Never    Diet:  Regular   Exercise:  Limited    Hobbies: Cooking     Family History  Problem Relation Age of Onset  . Hypertension  Mother   . GER disease Mother   . Cancer Father     Lung     Current Outpatient Prescriptions on File Prior to Visit  Medication Sig Dispense Refill  . ACETAMINOPHEN-BUTALBITAL (BUPAP) 50-650 MG TABS Take 1 tablet by mouth as needed.      . cycloSPORINE (RESTASIS) 0.05 % ophthalmic emulsion 1 drop 2 (two) times daily.      . diazepam (VALIUM) 5 MG tablet Take 1/2 to 1 tab as needed for increased stressful  6 tablet  0  . diclofenac sodium (VOLTAREN) 1 % GEL Apply 4 g topically as needed.      . Estradiol Acetate (FEMRING) 0.1 MG/24HR RING Place 1 each vaginally every 3 (three) months.  0.9 each  3  . hydrOXYzine (VISTARIL) 25 MG capsule Take up to 2 per day for increased anxiety  30 capsule  0  . progesterone (PROMETRIUM) 100 MG capsule Take 1 capsule (100 mg total) by mouth at bedtime.  30 capsule  11   No current facility-administered medications on file prior to visit.     Allergies  Allergen Reactions  . Codeine      Immunization History  Administered Date(s) Administered  . Influenza,inj,Quad PF,36+ Mos 02/02/2013  . Tdap 03/13/2007       Objective:   Physical Exam  Nursing note and vitals reviewed. Constitutional: She is  oriented to person, place, and time.  Eyes: Conjunctivae are normal. No scleral icterus.  Neck: Neck supple. No thyromegaly present.  Cardiovascular: Normal rate, regular rhythm and normal heart sounds.   Pulmonary/Chest: Effort normal and breath sounds normal.  Musculoskeletal: She exhibits tenderness (Back/Groin Pain ). She exhibits no edema.  Lymphadenopathy:    She has no cervical adenopathy.  Neurological: She is alert and oriented to person, place, and time.  Skin: Skin is warm and dry.  Psychiatric: She has a normal mood and affect. Her behavior is normal. Judgment and thought content normal.      Assessment & Plan:   Britlyn was seen today for pelvic pain.  Diagnoses and associated orders for this visit:  Perineal pain in  female  Pain in joint, lower leg - pregabalin (LYRICA) 50 MG capsule; Take 1 capsule (50 mg total) by mouth 3 (three) times daily. - methylPREDNISolone sodium succinate (SOLU-MEDROL) 125 mg/2 mL injection 125 mg; Inject 2 mLs (125 mg total) into the muscle once. - ketorolac (TORADOL) injection 60 mg; Inject 2 mLs (60 mg total) into the muscle once.

## 2013-07-22 LAB — TSH: TSH: 1.626 u[IU]/mL (ref 0.350–4.500)

## 2013-07-27 ENCOUNTER — Other Ambulatory Visit: Payer: Commercial Indemnity

## 2013-08-13 ENCOUNTER — Other Ambulatory Visit: Payer: Self-pay | Admitting: Family Medicine

## 2013-08-17 ENCOUNTER — Encounter: Payer: Self-pay | Admitting: Family Medicine

## 2013-08-17 ENCOUNTER — Ambulatory Visit (INDEPENDENT_AMBULATORY_CARE_PROVIDER_SITE_OTHER): Payer: Commercial Indemnity | Admitting: Family Medicine

## 2013-08-17 VITALS — BP 130/79 | HR 72 | Resp 16 | Wt 141.0 lb

## 2013-08-17 DIAGNOSIS — K219 Gastro-esophageal reflux disease without esophagitis: Secondary | ICD-10-CM

## 2013-08-17 DIAGNOSIS — I10 Essential (primary) hypertension: Secondary | ICD-10-CM

## 2013-08-17 DIAGNOSIS — F3289 Other specified depressive episodes: Secondary | ICD-10-CM

## 2013-08-17 DIAGNOSIS — G43909 Migraine, unspecified, not intractable, without status migrainosus: Secondary | ICD-10-CM

## 2013-08-17 DIAGNOSIS — F329 Major depressive disorder, single episode, unspecified: Secondary | ICD-10-CM

## 2013-08-17 MED ORDER — IBUPROFEN 600 MG PO TABS: 600.0000 mg | ORAL_TABLET | Freq: Three times a day (TID) | ORAL | Status: AC | PRN

## 2013-08-17 MED ORDER — BUTALBITAL-ACETAMINOPHEN 50-300 MG PO TABS: 1.0000 | ORAL_TABLET | Freq: Two times a day (BID) | ORAL | Status: AC

## 2013-08-17 MED ORDER — WELLBUTRIN XL 300 MG PO TB24: 300.0000 mg | ORAL_TABLET | ORAL | Status: DC

## 2013-08-17 MED ORDER — PANTOPRAZOLE SODIUM 40 MG PO TBEC: 40.0000 mg | DELAYED_RELEASE_TABLET | Freq: Every day | ORAL | Status: DC

## 2013-08-17 MED ORDER — ALMOTRIPTAN MALATE 12.5 MG PO TABS: 12.5000 mg | ORAL_TABLET | ORAL | Status: DC | PRN

## 2013-08-17 MED ORDER — PROMETHAZINE HCL 12.5 MG PO TABS: ORAL_TABLET | ORAL | Status: DC

## 2013-08-17 MED ORDER — MAGNESIUM GLUCONATE 500 MG PO TABS: 500.0000 mg | ORAL_TABLET | Freq: Every day | ORAL | Status: AC

## 2013-08-17 MED ORDER — CYCLOBENZAPRINE HCL 5 MG PO TABS: 5.0000 mg | ORAL_TABLET | Freq: Every day | ORAL | Status: AC

## 2013-08-17 MED ORDER — FLUOXETINE HCL 20 MG PO CAPS: 20.0000 mg | ORAL_CAPSULE | Freq: Every day | ORAL | Status: DC

## 2013-08-17 MED ORDER — TOPIRAMATE 100 MG PO TABS: 100.0000 mg | ORAL_TABLET | Freq: Every day | ORAL | Status: DC

## 2013-08-17 MED ORDER — NEBIVOLOL HCL 5 MG PO TABS: 5.0000 mg | ORAL_TABLET | Freq: Every day | ORAL | Status: DC

## 2013-08-17 NOTE — Progress Notes (Signed)
Subjective:    Patient ID: Claudia Mitchell, female    DOB: [birth year], 58 y.o.   MRN: [id number]  HPI   Claudia Mitchell is here today to go over her most recent lab results and to discuss the conditions listed below:     1)  Mood - She is taking the combination of Wellbutrin and Prozac.  This combination seems to be working okay for her.  She lost her mother 3 weeks ago and feels that she has handled her mom's passing as well as can be expected.  She does have a prescription for Valium and Vistaril that she has not filled.    2)  Headaches - She needs refills on her headache medications.   3)  Hypertension - She is doing well on Bystolic (2.5 mg) 1/2 of 5 mg.     Review of Systems  Constitutional: Negative for activity change, fatigue and unexpected weight change.  HENT: Negative.   Eyes: Negative.   Respiratory: Negative for shortness of breath.   Cardiovascular: Negative for chest pain, palpitations and leg swelling.  Gastrointestinal: Negative for diarrhea and constipation.  Endocrine: Negative.   Genitourinary: Negative for difficulty urinating.  Musculoskeletal: Negative.   Skin: Negative.   Neurological: Negative.   Hematological: Negative for adenopathy. Does not bruise/bleed easily.  Psychiatric/Behavioral: Negative for sleep disturbance and dysphoric mood. The patient is not nervous/anxious.        Depressed mood.     Past Medical History  Diagnosis Date  . Hypertension   . Migraine headache   . Chronic constipation   . Clostridium difficile carrier   . Anemia   . Genital herpes      Past Surgical History  Procedure Laterality Date  . Partial hysterectomy       History   Social History Narrative   Marital Status: Married Claudia Mitchell)   Children:  G 4 P 3013   Pets:  None    Living Situation: Lives with spouse and mother.     Occupation: Art gallery manager   Alcohol Use:  Occasional    Drug Use:  None   Tobacco Use:  Never    Diet:  Regular   Exercise:  Limited    Hobbies: Cooking     Family History  Problem Relation Age of Onset  . Hypertension Mother   . GER disease Mother   . Cancer Father     Lung     Current Outpatient Prescriptions on File Prior to Visit  Medication Sig Dispense Refill  . ACETAMINOPHEN-BUTALBITAL (BUPAP) 50-650 MG TABS Take 1 tablet by mouth as needed.      . cycloSPORINE (RESTASIS) 0.05 % ophthalmic emulsion 1 drop 2 (two) times daily.      . diazepam (VALIUM) 5 MG tablet Take 1/2 to 1 tab as needed for increased stressful  6 tablet  0  . diclofenac sodium (VOLTAREN) 1 % GEL Apply 4 g topically as needed.      . Estradiol Acetate (FEMRING) 0.1 MG/24HR RING Place 1 each vaginally every 3 (three) months.  0.9 each  3  . hydrOXYzine (VISTARIL) 25 MG capsule Take up to 2 per day for increased anxiety  30 capsule  0  . pregabalin (LYRICA) 50 MG capsule Take 1 capsule (50 mg total) by mouth 3 (three) times daily.  90 capsule  1  . progesterone (PROMETRIUM) 100 MG capsule Take 1 capsule (100 mg total) by mouth at bedtime.  30 capsule  11   No current facility-administered medications on file prior to visit.     Allergies  Allergen Reactions  . Codeine      Immunization History  Administered Date(s) Administered  . Influenza,inj,Quad PF,36+ Mos 02/02/2013  . Tdap 03/13/2007      Objective:   Physical Exam  Nursing note and vitals reviewed. Constitutional: She appears well-nourished. No distress.  HENT:  Head: Normocephalic.  Eyes: No scleral icterus.  Neck: No thyromegaly present.  Cardiovascular: Normal rate, regular rhythm and normal heart sounds.   Pulmonary/Chest: Effort normal and breath sounds normal.  Musculoskeletal: She exhibits no edema and no tenderness.  Neurological: She is alert.  Skin: Skin is warm and dry.  Psychiatric: She has a normal mood and affect. Her behavior is normal. Judgment and thought content normal.  She is appropriately saddened by her  mom's passing.        Assessment & Plan:    Spruha was seen today for medication refill.  Diagnoses and associated orders for this visit:  Depressive disorder, not elsewhere classified - FLUoxetine (PROZAC) 20 MG capsule; Take 1 capsule (20 mg total) by mouth daily. - WELLBUTRIN XL 300 MG 24 hr tablet; Take 1 tablet (300 mg total) by mouth every morning.  GERD (gastroesophageal reflux disease) - pantoprazole (PROTONIX) 40 MG tablet; Take 1 tablet (40 mg total) by mouth daily.  Migraine, unspecified, without mention of intractable migraine without mention of status migrainosus - topiramate (TOPAMAX) 100 MG tablet; Take 1 tablet (100 mg total) by mouth at bedtime. - magnesium gluconate (MAGONATE) 500 MG tablet; Take 1 tablet (500 mg total) by mouth daily. - almotriptan (AXERT) 12.5 MG tablet; Take 1 tablet (12.5 mg total) by mouth as needed for migraine. may repeat in 2 hours if needed - Butalbital-Acetaminophen 50-300 MG TABS; Take 1 tablet by mouth 2 (two) times daily. - promethazine (PHENERGAN) 12.5 MG tablet; Take 1/2 - 1 tab po q 6 hours as needed for nausea - cyclobenzaprine (FLEXERIL) 5 MG tablet; Take 1 tablet (5 mg total) by mouth at bedtime. - ibuprofen (ADVIL,MOTRIN) 600 MG tablet; Take 1 tablet (600 mg total) by mouth every 8 (eight) hours as needed.  Essential hypertension, benign - nebivolol (BYSTOLIC) 5 MG tablet; Take 1 tablet (5 mg total) by mouth daily.   TIME SPENT "FACE TO FACE" WITH PATIENT -  30 MINS

## 2013-08-30 ENCOUNTER — Other Ambulatory Visit: Payer: Commercial Indemnity | Admitting: Family Medicine

## 2013-11-05 ENCOUNTER — Emergency Department (HOSPITAL_BASED_OUTPATIENT_CLINIC_OR_DEPARTMENT_OTHER)
Admission: EM | Admit: 2013-11-05 | Discharge: 2013-11-05 | Disposition: A | Discharge status: Home / Self Care | Payer: Commercial Indemnity | Attending: Emergency Medicine | Admitting: Emergency Medicine

## 2013-11-05 ENCOUNTER — Encounter (HOSPITAL_BASED_OUTPATIENT_CLINIC_OR_DEPARTMENT_OTHER): Payer: Self-pay | Admitting: Emergency Medicine

## 2013-11-05 ENCOUNTER — Emergency Department (HOSPITAL_BASED_OUTPATIENT_CLINIC_OR_DEPARTMENT_OTHER): Payer: Commercial Indemnity

## 2013-11-05 DIAGNOSIS — G43909 Migraine, unspecified, not intractable, without status migrainosus: Secondary | ICD-10-CM | POA: Insufficient documentation

## 2013-11-05 DIAGNOSIS — Z9889 Other specified postprocedural states: Secondary | ICD-10-CM | POA: Insufficient documentation

## 2013-11-05 DIAGNOSIS — Z87891 Personal history of nicotine dependence: Secondary | ICD-10-CM | POA: Insufficient documentation

## 2013-11-05 DIAGNOSIS — Z8619 Personal history of other infectious and parasitic diseases: Secondary | ICD-10-CM | POA: Insufficient documentation

## 2013-11-05 DIAGNOSIS — Z79899 Other long term (current) drug therapy: Secondary | ICD-10-CM | POA: Insufficient documentation

## 2013-11-05 DIAGNOSIS — Z8719 Personal history of other diseases of the digestive system: Secondary | ICD-10-CM | POA: Insufficient documentation

## 2013-11-05 DIAGNOSIS — I10 Essential (primary) hypertension: Secondary | ICD-10-CM | POA: Insufficient documentation

## 2013-11-05 DIAGNOSIS — Z862 Personal history of diseases of the blood and blood-forming organs and certain disorders involving the immune mechanism: Secondary | ICD-10-CM | POA: Insufficient documentation

## 2013-11-05 DIAGNOSIS — R102 Pelvic and perineal pain: Secondary | ICD-10-CM

## 2013-11-05 DIAGNOSIS — Z90710 Acquired absence of both cervix and uterus: Secondary | ICD-10-CM | POA: Insufficient documentation

## 2013-11-05 DIAGNOSIS — N949 Unspecified condition associated with female genital organs and menstrual cycle: Secondary | ICD-10-CM | POA: Insufficient documentation

## 2013-11-05 LAB — CBC WITH DIFFERENTIAL/PLATELET
BASOS PCT: 1 % (ref 0–1)
Basophils Absolute: 0 10*3/uL (ref 0.0–0.1)
EOS ABS: 0.3 10*3/uL (ref 0.0–0.7)
Eosinophils Relative: 4 % (ref 0–5)
HCT: 40.6 % (ref 36.0–46.0)
HEMOGLOBIN: 13.5 g/dL (ref 12.0–15.0)
LYMPHS ABS: 1.9 10*3/uL (ref 0.7–4.0)
Lymphocytes Relative: 32 % (ref 12–46)
MCH: 31.3 pg (ref 26.0–34.0)
MCHC: 33.3 g/dL (ref 30.0–36.0)
MCV: 94.2 fL (ref 78.0–100.0)
MONOS PCT: 11 % (ref 3–12)
Monocytes Absolute: 0.7 10*3/uL (ref 0.1–1.0)
NEUTROS ABS: 3.2 10*3/uL (ref 1.7–7.7)
Neutrophils Relative %: 53 % (ref 43–77)
Platelets: 295 10*3/uL (ref 150–400)
RBC: 4.31 MIL/uL (ref 3.87–5.11)
RDW: 12.3 % (ref 11.5–15.5)
WBC: 6.1 10*3/uL (ref 4.0–10.5)

## 2013-11-05 LAB — BASIC METABOLIC PANEL
Anion gap: 13 (ref 5–15)
BUN: 21 mg/dL (ref 6–23)
CHLORIDE: 108 meq/L (ref 96–112)
CO2: 21 mEq/L (ref 19–32)
Calcium: 9.3 mg/dL (ref 8.4–10.5)
Creatinine, Ser: 0.9 mg/dL (ref 0.50–1.10)
GFR, EST AFRICAN AMERICAN: 80 mL/min — AB (ref >90)
GFR, EST NON AFRICAN AMERICAN: 69 mL/min — AB (ref >90)
GLUCOSE: 109 mg/dL — AB (ref 70–99)
POTASSIUM: 3.9 meq/L (ref 3.7–5.3)
Sodium: 142 mEq/L (ref 137–147)

## 2013-11-05 LAB — URINALYSIS, ROUTINE W REFLEX MICROSCOPIC
Bilirubin Urine: NEGATIVE
GLUCOSE, UA: NEGATIVE mg/dL
HGB URINE DIPSTICK: NEGATIVE
Ketones, ur: NEGATIVE mg/dL
LEUKOCYTES UA: NEGATIVE
Nitrite: NEGATIVE
Protein, ur: NEGATIVE mg/dL
Specific Gravity, Urine: 1.024 (ref 1.005–1.030)
Urobilinogen, UA: 0.2 mg/dL (ref 0.0–1.0)
pH: 5.5 (ref 5.0–8.0)

## 2013-11-05 MED ORDER — IOHEXOL 300 MG/ML  SOLN
100.0000 mL | Freq: Once | INTRAMUSCULAR | Status: AC | PRN
  Administered 2013-11-05: 100 mL via INTRAVENOUS

## 2013-11-05 MED ORDER — HYDROCODONE-ACETAMINOPHEN 5-325 MG PO TABS: 1.0000 | ORAL_TABLET | ORAL | Status: DC | PRN

## 2013-11-05 MED ORDER — IOHEXOL 300 MG/ML  SOLN
50.0000 mL | Freq: Once | INTRAMUSCULAR | Status: AC | PRN
  Administered 2013-11-05: 50 mL via ORAL

## 2013-11-05 MED ORDER — FENTANYL CITRATE 0.05 MG/ML IJ SOLN
50.0000 ug | INTRAMUSCULAR | Status: DC | PRN
  Administered 2013-11-05: 50 ug via INTRAVENOUS
  Filled 2013-11-05: qty 2

## 2013-11-05 NOTE — ED Notes (Signed)
Pt. In no distress and reports pain in the R lower abd. And R groin area.

## 2013-11-05 NOTE — ED Notes (Signed)
Pt. Reports past history of hernia surgery.  Pt. Now having pain on the R side of her groin.  Per Pt. 20 yrs ago she had hernia surgery and now wondering about mesh placed and the recall.  Pt. Reports having an appt. With gyno who did surgery.  Pt. Reports she got report from 20 yrs ago today.  Pt. Reports no bleeding with urination.  Pt. Reports pain with exercising and straining.

## 2013-11-05 NOTE — ED Provider Notes (Signed)
Patient signed out to me to follow up on CAT scan report. Patient has been experiencing right lower abdominal pain in the inguinal region. Patient concerned about a history of hernia. CT scan was performed and was negative for any acute pathology including no sign of hernia. Patient reassured, will be treated with rest and analgesia.  Orpah Greek, MD 11/05/13 [phone]

## 2013-11-05 NOTE — ED Provider Notes (Signed)
CSN: [id number]     Arrival date & time 11/05/13  1416 History   First MD Initiated Contact with Patient 11/05/13 1442     Chief Complaint  Patient presents with  . Abdominal Pain     (Consider location/radiation/quality/duration/timing/severity/associated sxs/prior Treatment) HPI Comments: 58 year old female with history of high blood pressure, femoral/inguinal hernia repair 20 years prior, reflux presents with gradually worsening intermittent right groin pain. Patient feels it may be similar to her hernia years ago. No fevers chills or systemic symptoms. Patient feels pressure sensation in the right groin which goes towards the rectum and vaginal area. Patient has had a complete hysterectomy in the past and no vaginal or GI symptoms. Nothing specifically worsens or improves the symptoms.  Patient is a 58 y.o. female presenting with abdominal pain. The history is provided by the patient.  Abdominal Pain Associated symptoms: no chest pain, no chills, no dysuria, no fever, no nausea, no shortness of breath and no vomiting     Past Medical History  Diagnosis Date  . Hypertension   . Migraine headache   . Chronic constipation   . Clostridium difficile carrier   . Anemia   . Genital herpes    Past Surgical History  Procedure Laterality Date  . Partial hysterectomy    . Hernia repair    . Esophagogastroduodenoscopy endoscopy     Family History  Problem Relation Age of Onset  . Hypertension Mother   . GER disease Mother   . Cancer Father     Lung   History  Substance Use Topics  . Smoking status: Former Smoker -- 2.00 packs/day    Types: Cigarettes  . Smokeless tobacco: Never Used  . Alcohol Use: Yes     Comment: Occasional    OB History   Grav Para Term Preterm Abortions TAB SAB Ect Mult Living                 Review of Systems  Constitutional: Negative for fever and chills.  HENT: Negative for congestion.   Eyes: Negative for visual disturbance.  Respiratory:  Negative for shortness of breath.   Cardiovascular: Negative for chest pain.  Gastrointestinal: Positive for abdominal pain. Negative for nausea and vomiting.  Genitourinary: Negative for dysuria and flank pain.  Musculoskeletal: Negative for back pain, neck pain and neck stiffness.  Skin: Negative for rash.  Neurological: Negative for light-headedness and headaches.      Allergies  Codeine  Home Medications   Prior to Admission medications   Medication Sig Start Date End Date Taking? Authorizing Provider  ACETAMINOPHEN-BUTALBITAL (BUPAP) 50-650 MG TABS Take 1 tablet by mouth as needed.    Historical Provider, MD  almotriptan (AXERT) 12.5 MG tablet Take 1 tablet (12.5 mg total) by mouth as needed for migraine. may repeat in 2 hours if needed 08/17/13 08/17/14  Jonathon Resides, MD  Butalbital-Acetaminophen 50-300 MG TABS Take 1 tablet by mouth 2 (two) times daily. 08/17/13 08/18/14  Jonathon Resides, MD  cyclobenzaprine (FLEXERIL) 5 MG tablet Take 1 tablet (5 mg total) by mouth at bedtime. 08/17/13 08/18/14  Jonathon Resides, MD  cycloSPORINE (RESTASIS) 0.05 % ophthalmic emulsion 1 drop 2 (two) times daily.    Historical Provider, MD  diazepam (VALIUM) 5 MG tablet Take 1/2 to 1 tab as needed for increased stressful 02/02/13 02/02/14  Jonathon Resides, MD  diclofenac sodium (VOLTAREN) 1 % GEL Apply 4 g topically as needed.    Historical Provider, MD  Estradiol Acetate (  FEMRING) 0.1 MG/24HR RING Place 1 each vaginally every 3 (three) months. 02/02/13   Jonathon Resides, MD  FLUoxetine (PROZAC) 20 MG capsule Take 1 capsule (20 mg total) by mouth daily. 08/17/13 08/17/14  Jonathon Resides, MD  hydrOXYzine (VISTARIL) 25 MG capsule Take up to 2 per day for increased anxiety 02/02/13 02/02/14  Jonathon Resides, MD  ibuprofen (ADVIL,MOTRIN) 600 MG tablet Take 1 tablet (600 mg total) by mouth every 8 (eight) hours as needed. 08/17/13 08/17/14  Jonathon Resides, MD  magnesium gluconate (MAGONATE) 500 MG tablet Take 1  tablet (500 mg total) by mouth daily. 08/17/13 08/17/14  Jonathon Resides, MD  nebivolol (BYSTOLIC) 5 MG tablet Take 2.5 mg by mouth daily. 08/17/13 08/17/14  Jonathon Resides, MD  pantoprazole (PROTONIX) 40 MG tablet Take 40 mg by mouth every other day. 08/17/13 08/17/14  Jonathon Resides, MD  pregabalin (LYRICA) 50 MG capsule Take 1 capsule (50 mg total) by mouth 3 (three) times daily. 07/21/13 07/22/14  Jonathon Resides, MD  progesterone (PROMETRIUM) 100 MG capsule Take 1 capsule (100 mg total) by mouth at bedtime. 02/02/13 02/02/14  Jonathon Resides, MD  promethazine (PHENERGAN) 12.5 MG tablet Take 1/2 - 1 tab po q 6 hours as needed for nausea 08/17/13 08/18/14  Jonathon Resides, MD  topiramate (TOPAMAX) 100 MG tablet Take 1 tablet (100 mg total) by mouth at bedtime. 08/17/13 08/17/14  Jonathon Resides, MD  WELLBUTRIN XL 300 MG 24 hr tablet Take 1 tablet (300 mg total) by mouth every morning. 08/17/13 08/17/14  Jonathon Resides, MD   BP 135/80  Pulse 69  Temp(Src) 97.8 F (36.6 C) (Oral)  Resp 18  Ht 5' (1.524 m)  Wt 140 lb (63.504 kg)  BMI 27.34 kg/m2  SpO2 100% Physical Exam  Nursing note and vitals reviewed. Constitutional: She is oriented to person, place, and time. She appears well-developed and well-nourished.  HENT:  Head: Normocephalic and atraumatic.  Eyes: Conjunctivae are normal. Right eye exhibits no discharge. Left eye exhibits no discharge.  Neck: Normal range of motion. Neck supple. No tracheal deviation present.  Cardiovascular: Normal rate and regular rhythm.   Pulmonary/Chest: Effort normal and breath sounds normal.  Abdominal: Soft. She exhibits no distension. There is no tenderness (is is no femoral final hernia palpated.). There is no guarding.  Musculoskeletal: She exhibits no edema.  Neurological: She is alert and oriented to person, place, and time.  Skin: Skin is warm. No rash noted.  Psychiatric: She has a normal mood and affect.    ED Course  Procedures (including critical care  time) Labs Review Labs Reviewed  CBC WITH DIFFERENTIAL  BASIC METABOLIC PANEL  URINALYSIS, ROUTINE W REFLEX MICROSCOPIC    Imaging Review No results found.   EKG Interpretation None      MDM   Final diagnoses:  None    Well-appearing female with worsening right groin symptoms. Discussed differential including hernia, musculoskeletal, mass, other. CT abdomen pelvis ordered for further delineation of worsening symptoms. Pain meds ordered and blood work pending. Patient's care be signed out to followup CT scan for final disposition.  Filed Vitals:   11/05/13 1426 11/05/13 1434  BP: 138/64 135/80  Pulse: 70 69  Temp: 97.9 F (36.6 C) 97.8 F (36.6 C)  TempSrc: Oral Oral  Resp: 20 18  Height: 5' 0.5" (1.537 m) 5' (1.524 m)  Weight: 140 lb (63.504 kg) 140 lb (63.504 kg)  SpO2: 100% 100%  Right inguinal pain  Mariea Clonts, MD 11/05/13 [phone]

## 2013-11-05 NOTE — Discharge Instructions (Signed)

## 2013-11-08 ENCOUNTER — Other Ambulatory Visit: Payer: Commercial Indemnity

## 2013-11-22 ENCOUNTER — Ambulatory Visit (INDEPENDENT_AMBULATORY_CARE_PROVIDER_SITE_OTHER): Payer: Commercial Indemnity | Admitting: Family Medicine

## 2013-11-22 ENCOUNTER — Encounter: Payer: Self-pay | Admitting: Family Medicine

## 2013-11-22 VITALS — BP 122/73 | HR 66 | Resp 16 | Ht 60.0 in | Wt 141.0 lb

## 2013-11-22 DIAGNOSIS — K219 Gastro-esophageal reflux disease without esophagitis: Secondary | ICD-10-CM

## 2013-11-22 DIAGNOSIS — Z0000 Encounter for general adult medical examination without abnormal findings: Secondary | ICD-10-CM

## 2013-11-22 DIAGNOSIS — Z79890 Hormone replacement therapy (postmenopausal): Secondary | ICD-10-CM

## 2013-11-22 DIAGNOSIS — I10 Essential (primary) hypertension: Secondary | ICD-10-CM

## 2013-11-22 DIAGNOSIS — F329 Major depressive disorder, single episode, unspecified: Secondary | ICD-10-CM

## 2013-11-22 DIAGNOSIS — F3289 Other specified depressive episodes: Secondary | ICD-10-CM

## 2013-11-22 DIAGNOSIS — Z23 Encounter for immunization: Secondary | ICD-10-CM

## 2013-11-22 DIAGNOSIS — Z2911 Encounter for prophylactic immunotherapy for respiratory syncytial virus (RSV): Secondary | ICD-10-CM

## 2013-11-22 MED ORDER — NEBIVOLOL HCL 5 MG PO TABS: 5.0000 mg | ORAL_TABLET | Freq: Every day | ORAL | Status: DC

## 2013-11-22 MED ORDER — WELLBUTRIN XL 300 MG PO TB24: 300.0000 mg | ORAL_TABLET | ORAL | Status: DC

## 2013-11-22 MED ORDER — ESTRADIOL ACETATE 0.1 MG/24HR VA RING: 1.0000 | VAGINAL_RING | VAGINAL | Status: DC

## 2013-11-22 MED ORDER — PROGESTERONE MICRONIZED 100 MG PO CAPS: 100.0000 mg | ORAL_CAPSULE | Freq: Every day | ORAL | Status: DC

## 2013-11-22 MED ORDER — PANTOPRAZOLE SODIUM 40 MG PO TBEC: 40.0000 mg | DELAYED_RELEASE_TABLET | ORAL | Status: DC

## 2013-11-22 NOTE — Progress Notes (Signed)
Subjective:    Patient ID: Claudia Mitchell, female    DOB: January 31, 1956, 58 y.o.   MRN: [id number]  HPI  Claudia Mitchell is here today for her annual CPE and to discuss the following conditions:    1)  Menopause - She needs to have her hormones refilled.    2)  Abdominal Pain - She continues to have abdominal pain. She was recently seen in the ED and had a CT scan done which was normal.   3)  Hypertension - Her BP is controlled with Bystolic.    4)  Mood - Her mood is stable on Wellbutrin.      Review of Systems  Constitutional: Negative for activity change, appetite change, fatigue and unexpected weight change.  HENT: Negative for congestion, dental problem, ear pain, hearing loss, trouble swallowing and voice change.   Eyes: Negative for pain, redness and visual disturbance.  Respiratory: Negative for cough and shortness of breath.   Cardiovascular: Negative for chest pain, palpitations and leg swelling.  Gastrointestinal: Positive for abdominal pain. Negative for nausea, vomiting, diarrhea, constipation and blood in stool.  Endocrine: Negative for cold intolerance, heat intolerance, polydipsia, polyphagia and polyuria.  Genitourinary: Negative for dysuria, urgency, frequency, hematuria, vaginal discharge and pelvic pain.  Musculoskeletal: Negative for arthralgias, back pain, joint swelling, myalgias and neck pain.  Skin: Negative for rash.  Neurological: Negative for dizziness, weakness and headaches.  Hematological: Negative for adenopathy. Does not bruise/bleed easily.  Psychiatric/Behavioral: Negative for sleep disturbance, dysphoric mood and decreased concentration. The patient is nervous/anxious.   All other systems reviewed and are negative.    Past Medical History  Diagnosis Date  . Hypertension   . Migraine headache   . Chronic constipation   . Clostridium difficile carrier   . Anemia   . Genital herpes      Past Surgical History  Procedure Laterality Date  . Partial  hysterectomy    . Hernia repair    . Esophagogastroduodenoscopy endoscopy       History   Social History Narrative   Marital Status: Married Barnabas Lister)   Children:  G 4 P 3013   Pets:  None    Living Situation: Lives with spouse and mother.     Occupation: Art gallery manager   Alcohol Use:  Occasional    Drug Use:  None   Tobacco Use:  Never    Diet:  Regular   Exercise:  Limited    Hobbies: Cooking     Family History  Problem Relation Age of Onset  . Hypertension Mother   . GER disease Mother   . Cancer Father     Lung     Current Outpatient Prescriptions on File Prior to Visit  Medication Sig Dispense Refill  . ACETAMINOPHEN-BUTALBITAL (BUPAP) 50-650 MG TABS Take 1 tablet by mouth as needed.      Marland Kitchen almotriptan (AXERT) 12.5 MG tablet Take 1 tablet (12.5 mg total) by mouth as needed for migraine. may repeat in 2 hours if needed  10 tablet  11  . Butalbital-Acetaminophen 50-300 MG TABS Take 1 tablet by mouth 2 (two) times daily.  30 tablet  2  . cyclobenzaprine (FLEXERIL) 5 MG tablet Take 1 tablet (5 mg total) by mouth at bedtime.  30 tablet  2  . cycloSPORINE (RESTASIS) 0.05 % ophthalmic emulsion 1 drop 2 (two) times daily.      . diclofenac sodium (VOLTAREN) 1 % GEL Apply 4 g  topically as needed.      Marland Kitchen FLUoxetine (PROZAC) 20 MG capsule Take 1 capsule (20 mg total) by mouth daily.  30 capsule  5  . ibuprofen (ADVIL,MOTRIN) 600 MG tablet Take 1 tablet (600 mg total) by mouth every 8 (eight) hours as needed.  90 tablet  5  . magnesium gluconate (MAGONATE) 500 MG tablet Take 1 tablet (500 mg total) by mouth daily.  30 tablet  11  . promethazine (PHENERGAN) 12.5 MG tablet Take 1/2 - 1 tab po q 6 hours as needed for nausea  30 tablet  2  . topiramate (TOPAMAX) 100 MG tablet Take 1 tablet (100 mg total) by mouth at bedtime.  30 tablet  11   No current facility-administered medications on file prior to visit.     Allergies  Allergen Reactions    . Codeine      Immunization History  Administered Date(s) Administered  . Influenza,inj,Quad PF,36+ Mos 02/02/2013  . Tdap 03/13/2007  . Zoster 11/22/2013       Objective:   Physical Exam  Vitals reviewed. Constitutional: She is oriented to person, place, and time. She appears well-developed and well-nourished.  HENT:  Head: Normocephalic and atraumatic.  Right Ear: External ear normal.  Left Ear: External ear normal.  Nose: Nose normal.  Mouth/Throat: Oropharynx is clear and moist.  Eyes: Conjunctivae and EOM are normal. Pupils are equal, round, and reactive to light.  Neck: Normal range of motion. No thyromegaly present.  Cardiovascular: Normal rate, regular rhythm, normal heart sounds and intact distal pulses.  Exam reveals no gallop and no friction rub.   No murmur heard. Pulmonary/Chest: Effort normal and breath sounds normal. Right breast exhibits no inverted nipple, no mass, no nipple discharge, no skin change and no tenderness. Left breast exhibits no inverted nipple, no mass, no nipple discharge, no skin change and no tenderness. Breasts are symmetrical.  Abdominal: Soft. Bowel sounds are normal. Hernia confirmed negative in the right inguinal area and confirmed negative in the left inguinal area.  Genitourinary: Rectum normal and vagina normal. Pelvic exam was performed with patient supine. There is no rash, tenderness or lesion on the right labia. There is no rash, tenderness or lesion on the left labia. No vaginal discharge found.  Musculoskeletal: Normal range of motion. She exhibits no edema and no tenderness.  Lymphadenopathy:    She has no cervical adenopathy.       Right: No inguinal adenopathy present.       Left: No inguinal adenopathy present.  Neurological: She is alert and oriented to person, place, and time. She has normal reflexes.  Skin: Skin is warm and dry.  Psychiatric: She has a normal mood and affect. Her behavior is normal. Judgment and thought  content normal.      Assessment & Plan:    Claudia Mitchell was seen today for annual exam, medical management of chronic issues and medication refill.  Diagnoses and associated orders for this visit:  Routine general medical examination at a health care facility  Post-menopause on HRT (hormone replacement therapy) - Estradiol Acetate (FEMRING) 0.1 MG/24HR RING; Place 1 each vaginally every 3 (three) months. - progesterone (PROMETRIUM) 100 MG capsule; Take 1 capsule (100 mg total) by mouth at bedtime.  Depressive disorder, not elsewhere classified - WELLBUTRIN XL 300 MG 24 hr tablet; Take 1 tablet (300 mg total) by mouth every morning.  Essential hypertension, benign - nebivolol (BYSTOLIC) 5 MG tablet; Take 1 tablet (5 mg total) by mouth daily.  Gastroesophageal reflux disease without esophagitis - pantoprazole (PROTONIX) 40 MG tablet; Take 1 tablet (40 mg total) by mouth every other day.  Need for shingles vaccine - Varicella-zoster vaccine subcutaneous   TIME SPENT "FACE TO FACE" WITH PATIENT - 45 MINS

## 2013-11-22 NOTE — Patient Instructions (Addendum)
1) Memory Issues - Complete the ADD questionnaire.  You might benefit from a medication like Vyvanse.  You may also want to add some Gingko and/or Tyrosine and try decrease your Topamax to 50 mg.    2)  Menopausal Symptoms - Try the combination of Prometrium 100 mg at night plus Bi-Est 80/20 and testosterone.

## 2013-12-15 ENCOUNTER — Other Ambulatory Visit: Payer: Self-pay | Admitting: Family Medicine

## 2013-12-15 DIAGNOSIS — M25559 Pain in unspecified hip: Secondary | ICD-10-CM

## 2013-12-15 DIAGNOSIS — K581 Irritable bowel syndrome with constipation: Secondary | ICD-10-CM

## 2013-12-15 MED ORDER — LINACLOTIDE 145 MCG PO CAPS: 145.0000 ug | ORAL_CAPSULE | Freq: Every day | ORAL | Status: DC

## 2013-12-15 MED ORDER — PREGABALIN 75 MG PO CAPS: 75.0000 mg | ORAL_CAPSULE | Freq: Two times a day (BID) | ORAL | Status: AC

## 2013-12-15 NOTE — Telephone Encounter (Signed)
Claudia Mitchell has been having pelvic pain for sometime and was recently seen by Dr. Ophelia Charter.  He really does not feel that her pain is due to GYN issues.  He suggested that she try something other than the tea she uses for constipation and thought that Linzess would work well.  He also set her up to see Dr. Nelva Bush again because of the disk issue noted on her recent CT scan of the abdomen.  She has been taking Lyrica for this.  I sent in a prescription for both Linzess and Lyrica.  She is to contact Dr. Nelva Bush' office to see if they can order a MRI of the lumbar spine before her visit with him in September.  (RZ)

## 2014-06-28 DIAGNOSIS — L814 Other melanin hyperpigmentation: Secondary | ICD-10-CM

## 2014-06-28 DIAGNOSIS — C4492 Squamous cell carcinoma of skin, unspecified: Secondary | ICD-10-CM

## 2014-06-28 DIAGNOSIS — D229 Melanocytic nevi, unspecified: Secondary | ICD-10-CM

## 2014-06-28 HISTORY — DX: Melanocytic nevi, unspecified: D22.9

## 2014-06-28 HISTORY — DX: Squamous cell carcinoma of skin, unspecified: C44.92

## 2014-06-28 HISTORY — DX: Other melanin hyperpigmentation: L81.4

## 2014-11-10 ENCOUNTER — Other Ambulatory Visit: Payer: Self-pay

## 2014-11-10 DIAGNOSIS — Z1231 Encounter for screening mammogram for malignant neoplasm of breast: Secondary | ICD-10-CM

## 2014-11-14 ENCOUNTER — Ambulatory Visit
Admission: RE | Admit: 2014-11-14 | Discharge: 2014-11-14 | Disposition: A | Discharge status: Home / Self Care | Source: Ambulatory Visit

## 2014-11-14 DIAGNOSIS — Z1231 Encounter for screening mammogram for malignant neoplasm of breast: Secondary | ICD-10-CM

## 2015-09-25 ENCOUNTER — Other Ambulatory Visit (HOSPITAL_COMMUNITY): Payer: Self-pay | Admitting: Family Medicine

## 2015-09-25 DIAGNOSIS — R1084 Generalized abdominal pain: Secondary | ICD-10-CM

## 2015-11-16 ENCOUNTER — Other Ambulatory Visit: Payer: Self-pay | Admitting: Family Medicine

## 2015-11-16 DIAGNOSIS — Z1231 Encounter for screening mammogram for malignant neoplasm of breast: Secondary | ICD-10-CM

## 2015-11-30 ENCOUNTER — Ambulatory Visit
Admission: RE | Admit: 2015-11-30 | Discharge: 2015-11-30 | Disposition: A | Discharge status: Home / Self Care | Payer: Self-pay | Source: Ambulatory Visit | Attending: Family Medicine | Admitting: Family Medicine

## 2015-11-30 DIAGNOSIS — Z1231 Encounter for screening mammogram for malignant neoplasm of breast: Secondary | ICD-10-CM

## 2016-04-12 HISTORY — PX: SPINAL CORD STIMULATOR IMPLANT: SHX2422

## 2016-04-22 HISTORY — PX: BREAST REDUCTION SURGERY: SHX8

## 2016-04-22 HISTORY — PX: REDUCTION MAMMAPLASTY: SUR839

## 2016-07-16 ENCOUNTER — Ambulatory Visit (INDEPENDENT_AMBULATORY_CARE_PROVIDER_SITE_OTHER): Payer: TRICARE For Life (TFL) | Admitting: Allergy and Immunology

## 2016-07-16 ENCOUNTER — Encounter: Payer: Self-pay | Admitting: Allergy and Immunology

## 2016-07-16 VITALS — BP 158/82 | HR 64 | Temp 98.3°F | Resp 16 | Ht 60.28 in | Wt 144.6 lb

## 2016-07-16 DIAGNOSIS — L253 Unspecified contact dermatitis due to other chemical products: Secondary | ICD-10-CM | POA: Diagnosis not present

## 2016-07-16 DIAGNOSIS — J3089 Other allergic rhinitis: Secondary | ICD-10-CM | POA: Diagnosis not present

## 2016-07-16 DIAGNOSIS — L308 Other specified dermatitis: Secondary | ICD-10-CM

## 2016-07-16 DIAGNOSIS — Z91038 Other insect allergy status: Secondary | ICD-10-CM | POA: Diagnosis not present

## 2016-07-16 DIAGNOSIS — L989 Disorder of the skin and subcutaneous tissue, unspecified: Secondary | ICD-10-CM

## 2016-07-16 DIAGNOSIS — Z91030 Bee allergy status: Secondary | ICD-10-CM

## 2016-07-16 MED ORDER — MONTELUKAST SODIUM 10 MG PO TABS: 10.0000 mg | ORAL_TABLET | Freq: Every day | ORAL | 5 refills | Status: DC

## 2016-07-16 NOTE — Progress Notes (Signed)
Dear Dr. Dion Saucier,  Thank you for referring Claudia Mitchell to the [facility] on 07/16/2016.   Below is a summation of this patient's evaluation and recommendations.  Thank you for your referral. I will keep you informed about this patient's response to treatment.   If you have any questions please do not hesitate to contact me.   Sincerely,  Jiles Prows, MD Allergy / [place]   ______________________________________________________________________    NEW PATIENT NOTE  Referring Provider: Jonathon Resides, MD Primary Provider: Ival Bible, MD Date of office visit: 07/16/2016    Subjective:   Chief Complaint:  Claudia Mitchell (DOB: 10-Oct-1955) is a 61 y.o. female who presents to the clinic on 07/16/2016 with a chief complaint of Allergic Reaction (Possible reaction to medication) and Other (Runny nose; allergy to bee stings) .     HPI: Claudia Mitchell presents to this clinic in evaluation of several distinct issues.  First, she has red raised itchy rash that appears to occur on a daily basis and will last several days and can occasionally be itchy. There is no obvious provoking factor giving rise to this issue and there is no associated systemic or constitutional symptoms. It does not appear to heal with hyperpigmentation or scar. This appears to involve her chest and her back and her legs for the most part and has been present for greater than one year. She was given some Protopic and when she does use this agent for a period in time it does appear to help but she does not really uses this agent very commonly.  Second, she appears to develop significant contact dermatitis to various agents including topical lidocaine, tanning spray, and [place] topical agent.  Third, she appears to have issues with runny nose and intermittent nasal congestion and some sneezing without any  associated anosmia or ugly nasal discharge or significant headaches and without any obvious trigger. This is been present for several years.  Fourth, she has apparently had a very severe reaction to bee stings. During her teenage years she was stung by some identified flying insect and developed throat closing and tongue thickness and a large local reaction at inoculation site and had been given an EpiPen. About 2 years ago she may of been stung in the face and developed very severe facial swelling. She's not entirely sure what may have stung her in the face but there was a honeybee in the local area around the time that this developed.  Past Medical History:  Diagnosis Date  . Acid reflux   . Anemia   . Barrett's esophagus   . Bee sting allergy   . Chronic constipation   . Clostridium difficile carrier   . Genital herpes   . Hypertension   . IBS (irritable bowel syndrome)   . Migraine headache     Past Surgical History:  Procedure Laterality Date  . CESAREAN SECTION     Three Cesarean sections  . ESOPHAGOGASTRODUODENOSCOPY ENDOSCOPY    . HERNIA REPAIR    . PARTIAL HYSTERECTOMY    . SPINAL CORD STIMULATOR IMPLANT  04/12/2016    Allergies as of 07/16/2016      Reactions   Bee Venom Anaphylaxis   Codeine Nausea And Vomiting   Severe vomiting   Lidocaine Rash   patch      Medication List      almotriptan 12.5 MG  tablet Commonly known as:  AXERT Take by mouth.   Biotin 1000 MCG Chew Chew by mouth.   cycloSPORINE 0.05 % ophthalmic emulsion Commonly known as:  RESTASIS 1 drop 2 (two) times daily.   EPINEPHrine 0.3 mg/0.3 mL Soaj injection Commonly known as:  EPI-PEN Inject 0.3 mg into the muscle.   FLECTOR 1.3 % Ptch Generic drug:  diclofenac UNW AND APP 1 PA  BID   L-Lysine 1000 MG Tabs Take by mouth.   Misc. Devices Misc Procedures: 04/12/16 S/P SCS Permanent Implant; 10/16/15 S/P Right Splanchnic RFA with almost 100% pain relief for 2 wks, then the pain crept  up to 25-50% pain relief; 07/17/15 S/P b/l splanchnic blocks;  12/5-12/17 S/P SPINAL CORD STIMULATOR TRIAL USING TWO PERCUTANEOUS LEADS; Removal of percutaneous SCS leads.  She is only taking Tramadol 50mg  q6hr prn pain.  There are no special instruct...   nebivolol 5 MG tablet Commonly known as:  BYSTOLIC Take by mouth.   OSTEO BI-FLEX JOINT SHIELD PO Take by mouth.   OXcarbazepine 150 MG tablet Commonly known as:  TRILEPTAL Take 300 mg by mouth.   tacrolimus 0.1 % ointment Commonly known as:  PROTOPIC Apply topically.   topiramate 100 MG tablet Commonly known as:  TOPAMAX Take 1 tablet (100 mg total) by mouth at bedtime.   traMADol 50 MG tablet Commonly known as:  ULTRAM Take 50 mg by mouth 3 (three) times daily.   TURMERIC PO Take by mouth.       Review of systems negative except as noted in HPI / PMHx or noted below:  Review of Systems  Constitutional: Negative.   HENT: Negative.   Eyes: Negative.   Respiratory: Negative.   Cardiovascular: Negative.   Gastrointestinal: Negative.   Genitourinary: Negative.   Musculoskeletal: Negative.   Skin: Negative.   Neurological: Negative.   Endo/Heme/Allergies: Negative.   Psychiatric/Behavioral: Negative.     Family History  Problem Relation Age of Onset  . Hypertension Mother   . GER disease Mother   . Cancer Father     Lung    Social History   Social History  . Marital status: Married    Spouse name: Barnabas Lister   . Number of children: 3  . Years of education: 12   Occupational History  . SALES / DESIGN  [employer]    Social History Main Topics  . Smoking status: Former Smoker    Packs/day: 2.00    Types: Cigarettes  . Smokeless tobacco: Never Used  . Alcohol use Yes     Comment: Occasional   . Drug use: No  . Sexual activity: Yes    Partners: Male   Other Topics Concern  . Not on file   Social History Narrative   Marital Status: Married Barnabas Lister)   Children:  G 4 P 3013    Pets:  None    Living Situation: Lives with spouse and mother.     Occupation: Art gallery manager   Alcohol Use:  Occasional    Drug Use:  None   Tobacco Use:  Never    Diet:  Regular   Exercise:  Limited    Hobbies: Agricultural engineer and Social history  Lives in a house with a dry environment, no animals located inside the household, carpeting in the bedroom, no plastic on the bed or pillow, no smokers located inside the household. She works in an office  setting.  Objective:   Vitals:   07/16/16 0928  BP: (!) 158/82  Pulse: 64  Resp: 16  Temp: 98.3 F (36.8 C)   Height: 5' 0.28" (153.1 cm) Weight: 144 lb 9.6 oz (65.6 kg)  Physical Exam  Constitutional: She is well-developed, well-nourished, and in no distress.  HENT:  Head: Normocephalic. Head is without right periorbital erythema and without left periorbital erythema.  Right Ear: Tympanic membrane, external ear and ear canal normal.  Left Ear: Tympanic membrane, external ear and ear canal normal.  Nose: Nose normal. No mucosal edema or rhinorrhea.  Mouth/Throat: Uvula is midline, oropharynx is clear and moist and mucous membranes are normal. No oropharyngeal exudate.  Eyes: Conjunctivae and lids are normal. Pupils are equal, round, and reactive to light.  Neck: Trachea normal. No tracheal tenderness present. No tracheal deviation present. No thyromegaly present.  Cardiovascular: Normal rate, regular rhythm, S1 normal, S2 normal and normal heart sounds.   No murmur heard. Pulmonary/Chest: Effort normal and breath sounds normal. No stridor. No tachypnea. No respiratory distress. She has no wheezes. She has no rales. She exhibits no tenderness.  Abdominal: Soft. She exhibits no distension and no mass. There is no hepatosplenomegaly. There is no tenderness. There is no rebound and no guarding.  Musculoskeletal: She exhibits no edema or tenderness.  Lymphadenopathy:       Head (right  side): No tonsillar adenopathy present.       Head (left side): No tonsillar adenopathy present.    She has no cervical adenopathy.    She has no axillary adenopathy.  Neurological: She is alert. Gait normal.  Skin: Rash (patchy erythematous areas involving shins bilaterally) noted. She is not diaphoretic. No erythema. No pallor. Nails show no clubbing.  Psychiatric: Mood and affect normal.    Diagnostics: Allergy skin tests were performed. She demonstrated hypersensitivity against trees, grasses, and weeds.  Assessment and Plan:    1. Inflammatory dermatosis   2. Other allergic rhinitis   3. Contact dermatitis due to chemicals   4. Hymenoptera allergy     1. Allergen avoidance measures  2. Treat and prevent inflammation:   A. montelukast 10 mg daily  B. OTC Rhinocort/Nasacort one spray each nostril daily  C. continue Protopic applied to skin 1-7 times per week  3. Return to clinic after mid April for patch testing. No systemic steroids  4. Evaluation in venom clinic.  5. If needed:   A. EpiPen, Benadryl, M.D./ER for allergic reaction  B. OTC antihistamine  Claudia Mitchell does appear to have atopic disease most likely giving rise to inflammation in both her airway and scan and we'll treat her with the therapy mentioned above which includes allergen avoidance measures as best as possible and anti-inflammatory agents for both her respiratory tract and skin. As well, we need to work through the issue of her Hymenoptera allergy and also work through the issue of her contact dermatitis. We need to wait until mid-April to place patch test for evaluation contact dermatitis that she recently just had a systemic steroid.  Jiles Prows, MD [facility]

## 2016-07-16 NOTE — Patient Instructions (Addendum)
  1. Allergen avoidance measures  2. Treat and prevent inflammation:   A. montelukast 10 mg daily  B. OTC Rhinocort/Nasacort one spray each nostril daily  C. continue Protopic applied to skin 1-7 times per week  3. Return to clinic after mid April for patch testing. No systemic steroids  4. Evaluation in venom clinic.  5. If needed:   A. EpiPen, Benadryl, M.D./ER for allergic reaction  B. OTC antihistamine

## 2016-07-31 ENCOUNTER — Ambulatory Visit: Payer: Self-pay | Admitting: Allergy and Immunology

## 2016-08-06 ENCOUNTER — Ambulatory Visit (INDEPENDENT_AMBULATORY_CARE_PROVIDER_SITE_OTHER): Payer: TRICARE For Life (TFL) | Admitting: Allergy and Immunology

## 2016-08-06 ENCOUNTER — Encounter: Payer: Self-pay | Admitting: Allergy and Immunology

## 2016-08-06 ENCOUNTER — Telehealth: Payer: Self-pay | Admitting: Allergy and Immunology

## 2016-08-06 VITALS — BP 140/80 | HR 68 | Resp 16

## 2016-08-06 DIAGNOSIS — L253 Unspecified contact dermatitis due to other chemical products: Secondary | ICD-10-CM | POA: Diagnosis not present

## 2016-08-06 NOTE — Telephone Encounter (Signed)
I thought we could advised patient ok to take but want to make sure

## 2016-08-06 NOTE — Telephone Encounter (Signed)
Can take antihistamine

## 2016-08-06 NOTE — Progress Notes (Signed)
Claudia Mitchell returns to this clinic to have a patch test placed. In review, she has a history of possible contact dermatitis that at this point in time is under relatively good control while using Protopic on her shins. As well, she does have an issue with rhinitis and headache but both of these are really under very good control at this point in time while using her montelukast and Rhinocort. A patch test using the true test patch system was placed on her back today. She will return to this clinic Friday of this week for initial read.

## 2016-08-06 NOTE — Telephone Encounter (Signed)
Pt called and said that she was here this morning and was put on patch test and has started itching but thank it could be the tape and wante to know if she could take a antihistamine [phone].

## 2016-08-06 NOTE — Telephone Encounter (Signed)
Informed pt of what dr Neldon Mc has stated

## 2016-08-09 ENCOUNTER — Ambulatory Visit: Payer: TRICARE For Life (TFL) | Admitting: Allergy

## 2016-08-09 DIAGNOSIS — L253 Unspecified contact dermatitis due to other chemical products: Secondary | ICD-10-CM

## 2016-08-09 NOTE — Progress Notes (Signed)
    Follow-up Note  RE: ORNELLA CODERRE MRN: [id number] DOB: [birth year] Date of Office Visit: 08/09/2016  Primary care provider: Ival Bible, MD Referring provider: Jonathon Resides, MD   Crytal returns to the office today for the initial patch test interpretation, given suspected history of contact dermatitis.    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  Meticulous avoidance of these substances is recommended. If avoidance is not possible, the use of barrier creams or lotions is recommended.   If symptoms persist or progress despite meticulous avoidance of above substances, dermatology evaluation may be warranted.  Prudy Feeler, MD [facility]

## 2016-08-20 ENCOUNTER — Ambulatory Visit: Payer: Self-pay | Admitting: Allergy and Immunology

## 2016-12-24 ENCOUNTER — Ambulatory Visit: Payer: TRICARE For Life (TFL) | Admitting: Allergy and Immunology

## 2016-12-31 ENCOUNTER — Encounter: Payer: Self-pay | Admitting: Allergy and Immunology

## 2016-12-31 ENCOUNTER — Ambulatory Visit (INDEPENDENT_AMBULATORY_CARE_PROVIDER_SITE_OTHER): Payer: TRICARE For Life (TFL) | Admitting: Allergy and Immunology

## 2016-12-31 VITALS — BP 134/88 | HR 64 | Resp 18

## 2016-12-31 DIAGNOSIS — L989 Disorder of the skin and subcutaneous tissue, unspecified: Secondary | ICD-10-CM

## 2016-12-31 DIAGNOSIS — L253 Unspecified contact dermatitis due to other chemical products: Secondary | ICD-10-CM | POA: Diagnosis not present

## 2016-12-31 DIAGNOSIS — L308 Other specified dermatitis: Secondary | ICD-10-CM | POA: Diagnosis not present

## 2016-12-31 DIAGNOSIS — J3089 Other allergic rhinitis: Secondary | ICD-10-CM

## 2016-12-31 DIAGNOSIS — Z91038 Other insect allergy status: Secondary | ICD-10-CM

## 2016-12-31 DIAGNOSIS — Z91030 Bee allergy status: Secondary | ICD-10-CM

## 2016-12-31 MED ORDER — MOMETASONE FUROATE 0.1 % EX CREA: TOPICAL_CREAM | CUTANEOUS | 1 refills | Status: AC

## 2016-12-31 MED ORDER — MONTELUKAST SODIUM 10 MG PO TABS: 10.0000 mg | ORAL_TABLET | Freq: Every day | ORAL | 1 refills | Status: AC

## 2016-12-31 NOTE — Progress Notes (Signed)
Follow-up Note  Referring Provider: Jonathon Resides, MD Primary Provider: Jonathon Resides, MD Date of Office Visit: 12/31/2016  Subjective:   Claudia Mitchell (DOB: 11-18-1955) is a 61 y.o. female who returns to the [facility] on 12/31/2016 in re-evaluation of the following:  HPI: Claudia Mitchell returns to this clinic in evaluation of her eczematous dermatitis tied up with contact dermatitis, allergic rhinoconjunctivitis, and hymenoptera venom hypersensitivity state. I last saw her in his clinic during her initial evaluation of 07/16/2016. Around that point in time we performed patch testing which identified hypersensitivity against methyldibromoglutaronitrile, PPD, gold  She has attempted to perform allergen avoidance measures against these allergens. She still continues to have intermittent eczematous lesions especially involving her lower extremities. She will use Protopic for about a week and it does clear up these issues but if she stops Protopic they do appear to come back. She has had some outbreaks on her ears and other areas of her body that responded quite well to intermittent use of Protopic.  She has done better with her allergic rhinoconjunctivitis while consistently using montelukast and some dose of Rhinocort. Sometimes she still needs to add in an occasional antihistamine. Even though she uses this combination of therapy she still has runny nose and occasional sneezing.  She did not follow up in venom clinic for her Hymenoptera venom hypersensitivity state but is planning to do so at some point in the future.  Allergies as of 12/31/2016      Reactions   Bee Venom Anaphylaxis   Latex Itching   Codeine Nausea And Vomiting   Severe vomiting   Lidocaine Rash   patch   Cephalexin Rash   Tape Rash      Medication List      Biotin 1000 MCG Chew Chew by mouth.   cycloSPORINE 0.05 % ophthalmic emulsion Commonly known as:  RESTASIS 1 drop 2 (two) times daily.   DHEA PO Take 5 mg by mouth daily.   EPINEPHrine 0.3 mg/0.3 mL Soaj injection Commonly known as:  EPI-PEN Inject 0.3 mg into the muscle.   FLECTOR 1.3 % Ptch Generic drug:  diclofenac UNW AND APP 1 PA  BID   L-Lysine 1000 MG Tabs Take by mouth.   montelukast 10 MG tablet Commonly known as:  SINGULAIR Take 1 tablet (10 mg total) by mouth at bedtime.   nebivolol 5 MG tablet Commonly known as:  BYSTOLIC Take 10 mg by mouth.   OSTEO BI-FLEX JOINT SHIELD PO Take by mouth.   OXcarbazepine 150 MG tablet Commonly known as:  TRILEPTAL Take 300 mg by mouth.   PRESCRIPTION MEDICATION Take 5 mg by mouth daily. Bi-Est (20:80)   tacrolimus 0.1 % ointment Commonly known as:  PROTOPIC Apply topically.   traMADol 50 MG tablet Commonly known as:  ULTRAM Take 50 mg by mouth 3 (three) times daily.   triamcinolone 55 MCG/ACT Aero nasal inhaler Commonly known as:  NASACORT Place into the nose.   TURMERIC PO Take by mouth.       Past Medical History:  Diagnosis Date  . Acid reflux   . Anemia   . Barrett's esophagus   . Bee sting allergy   . Chronic constipation   . Clostridium difficile carrier   . Genital herpes   . Hypertension   . IBS (irritable bowel syndrome)   . Migraine headache     Past Surgical History:  Procedure Laterality Date  . BREAST REDUCTION SURGERY  2018  . CESAREAN SECTION     Three Cesarean sections  . ESOPHAGOGASTRODUODENOSCOPY ENDOSCOPY    . HERNIA REPAIR    . PARTIAL HYSTERECTOMY    . SPINAL CORD STIMULATOR IMPLANT  04/12/2016    Review of systems negative except as noted in HPI / PMHx or noted below:  Review of Systems  Constitutional: Negative.   HENT: Negative.   Eyes: Negative.   Respiratory: Negative.   Cardiovascular: Negative.   Gastrointestinal: Negative.   Genitourinary: Negative.   Musculoskeletal: Negative.   Skin: Negative.   Neurological: Negative.   Endo/Heme/Allergies: Negative.   Psychiatric/Behavioral:  Negative.      Objective:   Vitals:   12/31/16 1827  BP: 134/88  Pulse: 64  Resp: 18          Physical Exam  HENT:  Nose: Nose normal.  Skin: Rash (Erythematous macules involving lower extremity without any significant induration or scale from ankle to knee) noted.    Diagnostics: none   Assessment and Plan:   1. Inflammatory dermatosis   2. Contact dermatitis due to chemicals   3. Other allergic rhinitis   4. Hymenoptera allergy     1. Allergen avoidance measures  2. Treat and prevent upper airway inflammation:   A. montelukast 10 mg daily  B. OTC Rhinocort/Nasacort one spray each nostril daily  3. Use a combination of mometasone 0.1% cream followed by Protopic 0.1% ointment 1-7 times per week applied to dermatitis  4. Consider a course of immunotherapy for upper airway infalmmation  5. If needed:   A. EpiPen, Benadryl, M.D./ER for allergic reaction  B. OTC antihistamine - Claritin (Loratadine) / Zyrtec (Cetirizine) / Allergra  6. Evaluation in venom clinic  7. Obtain fall flu vaccine  8. Return to clinic in 12 weeks or earlier if problem  I will have Claudia Mitchell utilize a combination of anti-inflammatory medications for her cutaneous abnormality including a combination of a calcineurin inhibitor and a topical steroid as noted above. She will aim for the least amount of medication required to control her disease state in a preventative manner. She will continue on therapy for her upper airway inflammatory condition as noted above and if she fails medical therapy she would definitely be a candidate for immunotherapy. We will see her back in our next venom clinic to further define her hypersensitivity directed against specific Hymenoptera venom.  Allena Katz, MD Allergy / Immunology [place]

## 2016-12-31 NOTE — Patient Instructions (Addendum)
  1. Allergen avoidance measures  2. Treat and prevent upper airway inflammation:   A. montelukast 10 mg daily  B. OTC Rhinocort/Nasacort one spray each nostril daily  3. Use a combination of mometasone 0.1% cream followed by Protopic 0.1% ointment 1-7 times per week applied to dermatitis  4. Consider a course of immunotherapy for upper airway infalmmation  5. If needed:   A. EpiPen, Benadryl, M.D./ER for allergic reaction  B. OTC antihistamine - Claritin (Loratadine) / Zyrtec (Cetirizine) / Allergra  6. Evaluation in venom clinic  7. Obtain fall flu vaccine  8. Return to clinic in 12 weeks or earlier if problem

## 2017-09-04 ENCOUNTER — Other Ambulatory Visit: Payer: Self-pay | Admitting: Family Medicine

## 2017-09-04 DIAGNOSIS — Z1231 Encounter for screening mammogram for malignant neoplasm of breast: Secondary | ICD-10-CM

## 2017-09-26 ENCOUNTER — Ambulatory Visit: Payer: Self-pay

## 2017-09-30 ENCOUNTER — Ambulatory Visit
Admission: RE | Admit: 2017-09-30 | Discharge: 2017-09-30 | Disposition: A | Discharge status: Home / Self Care | Payer: TRICARE For Life (TFL) | Source: Ambulatory Visit | Attending: Family Medicine | Admitting: Family Medicine

## 2017-09-30 DIAGNOSIS — Z1231 Encounter for screening mammogram for malignant neoplasm of breast: Secondary | ICD-10-CM

## 2017-11-13 ENCOUNTER — Encounter: Payer: TRICARE For Life (TFL) | Admitting: Psychology

## 2017-12-02 ENCOUNTER — Ambulatory Visit (INDEPENDENT_AMBULATORY_CARE_PROVIDER_SITE_OTHER): Payer: TRICARE For Life (TFL) | Admitting: Psychology

## 2017-12-02 DIAGNOSIS — R413 Other amnesia: Secondary | ICD-10-CM | POA: Diagnosis not present
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
## 2017-12-04 ENCOUNTER — Encounter: Payer: Self-pay | Admitting: Psychology

## 2017-12-16 ENCOUNTER — Encounter: Payer: Self-pay | Admitting: Psychology

## 2017-12-16 ENCOUNTER — Ambulatory Visit: Payer: TRICARE For Life (TFL) | Admitting: Psychology

## 2017-12-16 DIAGNOSIS — R413 Other amnesia: Secondary | ICD-10-CM

## 2017-12-16 NOTE — Progress Notes (Signed)
   Neuropsychology Note  [name] completed 180 minutes of neuropsychological testing with technician, Milana Kidney, BS, under the supervision of Dr. Macarthur Critchley, Licensed Psychologist. The patient did not appear overtly distressed by the testing session, per behavioral observation or via self-report to the technician. Rest breaks were offered.   Clinical Decision Making: In considering the patient's current level of functioning, level of presumed impairment, nature of symptoms, emotional and behavioral responses during the interview, level of literacy, and observed level of motivation/effort, a battery of tests was selected and communicated to the psychometrician.  Communication between the psychologist and technician was ongoing throughout the testing session and changes were made as deemed necessary based on patient performance on testing, technician observations and additional pertinent factors such as those listed above.  Claudia Mitchell will return within approximately 2 weeks for an interactive feedback session with Dr. Si Raider at which time her test performances, clinical impressions and treatment recommendations will be reviewed in detail. The patient understands she can contact our office should she require our assistance before this time.  35 minutes spent performing neuropsychological evaluation services/clinical decision making (psychologist). [CPT 32202] 180 minutes spent face-to-face with patient administering standardized tests, 60 minutes spent scoring (technician). [CPT [id number], 54270]  Full report to follow.
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
## 2017-12-30 ENCOUNTER — Encounter: Payer: Self-pay | Admitting: Psychology

## 2017-12-30 ENCOUNTER — Ambulatory Visit (INDEPENDENT_AMBULATORY_CARE_PROVIDER_SITE_OTHER): Payer: TRICARE For Life (TFL) | Admitting: Psychology

## 2017-12-30 DIAGNOSIS — G3184 Mild cognitive impairment, so stated: Secondary | ICD-10-CM

## 2017-12-30 DIAGNOSIS — R413 Other amnesia: Secondary | ICD-10-CM

## 2018-02-18 ENCOUNTER — Other Ambulatory Visit: Payer: Self-pay | Admitting: Gastroenterology

## 2018-02-18 DIAGNOSIS — R1310 Dysphagia, unspecified: Secondary | ICD-10-CM

## 2018-02-23 ENCOUNTER — Other Ambulatory Visit: Payer: Self-pay | Admitting: Gastroenterology

## 2018-02-23 DIAGNOSIS — R1032 Left lower quadrant pain: Secondary | ICD-10-CM

## 2018-03-03 ENCOUNTER — Ambulatory Visit
Admission: RE | Admit: 2018-03-03 | Discharge: 2018-03-03 | Disposition: A | Discharge status: Home / Self Care | Payer: TRICARE For Life (TFL) | Source: Ambulatory Visit | Attending: Gastroenterology | Admitting: Gastroenterology

## 2018-03-03 DIAGNOSIS — R1310 Dysphagia, unspecified: Secondary | ICD-10-CM

## 2018-03-03 DIAGNOSIS — R1032 Left lower quadrant pain: Secondary | ICD-10-CM

## 2018-03-03 MED ORDER — IOPAMIDOL (ISOVUE-300) INJECTION 61%
100.0000 mL | Freq: Once | INTRAVENOUS | Status: AC | PRN
  Administered 2018-03-03: 100 mL via INTRAVENOUS

## 2018-03-30 ENCOUNTER — Other Ambulatory Visit: Payer: Self-pay | Admitting: Family Medicine

## 2018-03-30 DIAGNOSIS — G3184 Mild cognitive impairment, so stated: Secondary | ICD-10-CM

## 2018-03-30 DIAGNOSIS — R413 Other amnesia: Secondary | ICD-10-CM

## 2018-04-07 ENCOUNTER — Other Ambulatory Visit: Payer: TRICARE For Life (TFL)

## 2018-05-11 ENCOUNTER — Encounter

## 2018-05-11 ENCOUNTER — Encounter: Payer: TRICARE For Life (TFL) | Admitting: Psychology

## 2018-06-02 ENCOUNTER — Encounter: Payer: TRICARE For Life (TFL) | Admitting: Psychology

## 2018-06-02 ENCOUNTER — Ambulatory Visit
Admission: RE | Admit: 2018-06-02 | Discharge: 2018-06-02 | Disposition: A | Discharge status: Home / Self Care | Payer: TRICARE For Life (TFL) | Source: Ambulatory Visit | Attending: Family Medicine | Admitting: Family Medicine

## 2018-06-02 DIAGNOSIS — G3184 Mild cognitive impairment, so stated: Secondary | ICD-10-CM

## 2018-06-02 DIAGNOSIS — R413 Other amnesia: Secondary | ICD-10-CM

## 2018-08-12 ENCOUNTER — Ambulatory Visit (INDEPENDENT_AMBULATORY_CARE_PROVIDER_SITE_OTHER): Payer: TRICARE For Life (TFL)

## 2018-08-12 ENCOUNTER — Encounter (HOSPITAL_COMMUNITY): Payer: Self-pay

## 2018-08-12 ENCOUNTER — Ambulatory Visit (HOSPITAL_COMMUNITY)
Admission: EM | Admit: 2018-08-12 | Discharge: 2018-08-12 | Disposition: A | Discharge status: Home / Self Care | Payer: TRICARE For Life (TFL) | Attending: Family Medicine | Admitting: Family Medicine

## 2018-08-12 ENCOUNTER — Other Ambulatory Visit: Payer: Self-pay

## 2018-08-12 DIAGNOSIS — S52515A Nondisplaced fracture of left radial styloid process, initial encounter for closed fracture: Secondary | ICD-10-CM

## 2018-08-12 MED ORDER — NAPROXEN 375 MG PO TABS: 375.0000 mg | ORAL_TABLET | Freq: Two times a day (BID) | ORAL | 0 refills | Status: DC

## 2018-08-12 NOTE — ED Provider Notes (Signed)
[facility]   [id number] 08/12/18 Arrival Time: 9379  CC: left wrist pain  SUBJECTIVE: History from: patient. Claudia Mitchell is a 63 y.o. female hx significant for acid reflux, barrett's esophagus, chronic constipation, HTN, IBS, and migraine HAs, complains of left wrist pain that began earlier today.  Symptoms began after bracing herself with her left wrist to jump off the porch.  Localizes the pain to the outside of wrist.  Pain is intermittent 7/10.  Has tried 2 extra strength tylenol with minimal relief.  Symptoms are made worse with wrist ROM.  Denies similar symptoms in the past.  Complains of associated swelling, and numbness/ tingling in fingertips.  Denies fever, chills, erythema, ecchymosis, weakness.      ROS: As per HPI.  Past Medical History:  Diagnosis Date  . Acid reflux   . Anemia   . Barrett's esophagus   . Bee sting allergy   . Chronic constipation   . Clostridium difficile carrier   . Genital herpes   . Hypertension   . IBS (irritable bowel syndrome)   . Migraine headache    Past Surgical History:  Procedure Laterality Date  . BREAST REDUCTION SURGERY  2018  . CESAREAN SECTION     Three Cesarean sections  . ESOPHAGOGASTRODUODENOSCOPY ENDOSCOPY    . HERNIA REPAIR    . PARTIAL HYSTERECTOMY    . REDUCTION MAMMAPLASTY Bilateral 2018  . SPINAL CORD STIMULATOR IMPLANT  04/12/2016   Allergies  Allergen Reactions  . Bee Venom Anaphylaxis  . Latex Itching  . Codeine Nausea And Vomiting    Severe vomiting  . Lidocaine Rash    patch  . Cephalexin Rash  . Tape Rash   No current facility-administered medications on file prior to encounter.    Current Outpatient Medications on File Prior to Encounter  Medication Sig Dispense Refill  . Biotin 1000 MCG CHEW Chew by mouth.    . cycloSPORINE (RESTASIS) 0.05 % ophthalmic emulsion 1 drop 2 (two) times daily.    Marland Kitchen EPINEPHrine 0.3 mg/0.3 mL IJ SOAJ injection Inject 0.3 mg into the muscle.    Marland Kitchen FLECTOR  1.3 % PTCH UNW AND APP 1 PA  BID  1  . L-Lysine 1000 MG TABS Take by mouth.    . Misc Natural Products (OSTEO BI-FLEX JOINT SHIELD PO) Take by mouth.    . mometasone (ELOCON) 0.1 % cream Can apply to skin once daily if needed. 90 g 1  . montelukast (SINGULAIR) 10 MG tablet Take 1 tablet (10 mg total) by mouth at bedtime. 90 tablet 1  . morphine (MSIR) 15 MG tablet TK 1 T PO QID PRN  0  . nebivolol (BYSTOLIC) 5 MG tablet Take 10 mg by mouth.     . Nutritional Supplements (DHEA PO) Take 5 mg by mouth daily.    . OXcarbazepine (TRILEPTAL) 150 MG tablet Take 300 mg by mouth.    . Pantoprazole Sodium (PROTONIX PO) Take by mouth every morning.    Marland Kitchen PRESCRIPTION MEDICATION Take 5 mg by mouth daily. Bi-Est (20:80)    . Progesterone Micronized (PROGESTERONE PO) Take by mouth daily.    . Rizatriptan Benzoate (MAXALT PO) Take by mouth as needed.    . tacrolimus (PROTOPIC) 0.1 % ointment Apply topically.    . traMADol (ULTRAM) 50 MG tablet Take 50 mg by mouth 3 (three) times daily.    Marland Kitchen triamcinolone (NASACORT) 55 MCG/ACT AERO nasal inhaler Place 1 spray into the nose daily.     Marland Kitchen  TURMERIC PO Take by mouth.     Social History   Socioeconomic History  . Marital status: Married    Spouse name: Barnabas Lister   . Number of children: 3  . Years of education: 22  . Highest education level: Not on file  Occupational History  . Occupation: Press photographer / DESIGN     Employer: [employer]    Comment: [employer]   Social Needs  . Financial resource strain: Not on file  . Food insecurity:    Worry: Not on file    Inability: Not on file  . Transportation needs:    Medical: Not on file    Non-medical: Not on file  Tobacco Use  . Smoking status: Former Smoker    Packs/day: 2.00    Types: Cigarettes  . Smokeless tobacco: Never Used  Substance and Sexual Activity  . Alcohol use: Yes    Comment: Occasional   . Drug use: No  . Sexual activity: Yes    Partners: Male  Lifestyle  . Physical activity:     Days per week: Not on file    Minutes per session: Not on file  . Stress: Not on file  Relationships  . Social connections:    Talks on phone: Not on file    Gets together: Not on file    Attends religious service: Not on file    Active member of club or organization: Not on file    Attends meetings of clubs or organizations: Not on file    Relationship status: Not on file  . Intimate partner violence:    Fear of current or ex partner: Not on file    Emotionally abused: Not on file    Physically abused: Not on file    Forced sexual activity: Not on file  Other Topics Concern  . Not on file  Social History Narrative   Marital Status: Married Barnabas Lister)   Children:  G 4 P 3013   Pets:  None    Living Situation: Lives with spouse and mother.     Occupation: Art gallery manager   Alcohol Use:  Occasional    Drug Use:  None   Tobacco Use:  Never    Diet:  Regular   Exercise:  Limited    Hobbies: Cooking   Family History  Problem Relation Age of Onset  . Hypertension Mother   . GER disease Mother   . Cancer Father        Lung    OBJECTIVE:  Vitals:   08/12/18 1353  BP: (!) 160/78  Pulse: 76  Resp: 17  Temp: 98.4 F (36.9 C)  TempSrc: Oral  SpO2: 100%    General appearance: Alert; in no acute distress.  Head: NCAT Lungs: Normal respiratory effort CV: Radial pulse 2+; cap refill < 2 sec Musculoskeletal: left wrist  Inspection: Skin warm, dry, clear and intact without obvious erythema, effusion, or ecchymosis.  Palpation: diffusely TTP over distal radius and lateral wrist ROM: LROM Strength: decreased grip strength Skin: warm and dry Neurologic: Ambulates without difficulty Psychological: alert and cooperative; normal mood and affect  DIAGNOSTIC STUDIES:  Dg Wrist Complete Left  Result Date: 08/12/2018 CLINICAL DATA:  The patient suffered a left injury today when she fell off a porch. Initial encounter. EXAM: LEFT WRIST - COMPLETE  3+ VIEW COMPARISON:  None. FINDINGS: The patient has a nondisplaced fracture through the radial styloid. No other bony or joint abnormality is  identified. IMPRESSION: Nondisplaced radial styloid fracture. Electronically Signed   By: Inge Rise M.D.   On: 08/12/2018 14:38     ASSESSMENT & PLAN:  1. Nondisplaced fracture of left radial styloid process, initial encounter for closed fracture     Meds ordered this encounter  Medications  . DISCONTD: naproxen (NAPROSYN) 375 MG tablet    Sig: Take 1 tablet (375 mg total) by mouth 2 (two) times daily.    Dispense:  20 tablet    Refill:  0    Order Specific Question:   Supervising Provider    Answer:   Raylene Everts [[id number]]   X-rays showed nondisplaced radial styloid fracture Wrist splint placed.  Wear brace at all times.  You may remove temporarily for showering Will use extra strength tylenol as needed for pain.  Will use tramadol as needed for severe break-through pain Follow up with orthopedist for further evaluation and management Return or go to the ER if you have any new or worsening symptoms (fever, chills, worsening pain, increased pain or redness, etc...)   Reviewed expectations re: course of current medical issues. Questions answered. Outlined signs and symptoms indicating need for more acute intervention. Patient verbalized understanding. After Visit Summary given.    Lestine Box, PA-C 08/12/18 [phone]

## 2018-08-12 NOTE — Discharge Instructions (Addendum)
X-rays showed nondisplaced radial styloid fracture Wrist splint placed.  Wear brace at all times.  You may remove temporarily for showering Continue conservative management of rest, ice, and elevation Take naproxen as needed for pain relief (may cause abdominal discomfort, ulcers, and GI bleeds avoid taking with other NSAIDs) Follow up with orthopedist for further evaluation and management Return or go to the ER if you have any new or worsening symptoms (fever, chills, worsening pain, increased pain or redness, etc...)

## 2018-08-12 NOTE — ED Triage Notes (Signed)
Patient presents to Urgent Care with complaints of left wrist pain since earlier today when she jumped off her porch. Patient states she took 2 extra strength tylenol pta, is beginning to feel tingling/numbness in her fingertips.

## 2018-11-27 ENCOUNTER — Other Ambulatory Visit: Payer: Self-pay | Admitting: Family Medicine

## 2018-11-27 DIAGNOSIS — Z1231 Encounter for screening mammogram for malignant neoplasm of breast: Secondary | ICD-10-CM

## 2019-01-12 ENCOUNTER — Ambulatory Visit
Admission: RE | Admit: 2019-01-12 | Discharge: 2019-01-12 | Disposition: A | Discharge status: Home / Self Care | Payer: TRICARE For Life (TFL) | Source: Ambulatory Visit | Attending: Family Medicine | Admitting: Family Medicine

## 2019-01-12 ENCOUNTER — Other Ambulatory Visit: Payer: Self-pay

## 2019-01-12 DIAGNOSIS — Z1231 Encounter for screening mammogram for malignant neoplasm of breast: Secondary | ICD-10-CM

## 2019-02-25 ENCOUNTER — Telehealth: Payer: Self-pay | Admitting: Diagnostic Neuroimaging

## 2019-02-25 NOTE — Telephone Encounter (Signed)
I called patient and LVM regarding rescheduling 12/29 appt due to MD out. Patient is a new patient and can be r/s to 12/1 (reschedule day) if there are open spots when patient calls back.

## 2019-04-20 ENCOUNTER — Ambulatory Visit: Payer: TRICARE For Life (TFL) | Admitting: Diagnostic Neuroimaging

## 2019-05-03 ENCOUNTER — Encounter: Payer: Self-pay | Admitting: *Deleted

## 2019-05-04 ENCOUNTER — Ambulatory Visit (INDEPENDENT_AMBULATORY_CARE_PROVIDER_SITE_OTHER): Admitting: Diagnostic Neuroimaging

## 2019-05-04 ENCOUNTER — Encounter: Payer: Self-pay | Admitting: Diagnostic Neuroimaging

## 2019-05-04 ENCOUNTER — Other Ambulatory Visit: Payer: Self-pay

## 2019-05-04 VITALS — BP 157/83 | HR 79 | Temp 96.8°F | Ht 60.0 in | Wt 180.2 lb

## 2019-05-04 DIAGNOSIS — R413 Other amnesia: Secondary | ICD-10-CM | POA: Diagnosis not present

## 2019-05-04 DIAGNOSIS — G3184 Mild cognitive impairment, so stated: Secondary | ICD-10-CM | POA: Diagnosis not present

## 2019-05-04 NOTE — Progress Notes (Signed)
[facility]  PATIENT: Claudia Mitchell DOB: Sep 17, 1955  REFERRING CLINICIAN: Jonathon Resides, MD HISTORY FROM: patient and husband  REASON FOR VISIT: new consult    HISTORICAL  CHIEF COMPLAINT:  Chief Complaint  Patient presents with  . Memory changes    rm 7 New Pt, husbandBarnabas Lister MMSE 26    HISTORY OF PRESENT ILLNESS:   64 year old female here for evaluation of memory loss.  For past 3 years patient has noticed gradual onset progressive short-term memory loss, difficulty with concentration, feeling overwhelmed, word finding difficulties, having difficulty at home and at work.  Symptoms have been worse in the last 1 to 2 years.  Patient also deals with IBS, constipation, acid reflux, chronic pain, visceral pain, arthritis.  He is under the care of pain management clinic, orthopedist, rheumatologist and GI specialist.  Of note patient has had several traumatic experiences in her life including prior marital discord and stress when she was in her 40s raising 3 boys.  Ultimately she was able to separate from her ex-husband but then started having a variety of otherwise unexplained physical symptoms.  5 years ago patient's mother, who had Alzheimer's disease, passed away.  Since that time patient has had additional problems with pain, memory and anxiety issues.   REVIEW OF SYSTEMS: Full 14 system review of systems performed and negative with exception of: Anxiety racing thoughts memory loss snoring difficulty swallowing numbness cough rash joint pain.  ALLERGIES: Allergies  Allergen Reactions  . Bee Venom Anaphylaxis  . Latex Itching  . Codeine Nausea And Vomiting    Severe vomiting  . Lidocaine Rash    patch  . Cephalexin Rash  . Tape Rash    HOME MEDICATIONS: Outpatient Medications Prior to Visit  Medication Sig Dispense Refill  . Biotin 1000 MCG CHEW Chew by mouth.    . DULoxetine (CYMBALTA) 60 MG capsule Take 60 mg by mouth daily.    Marland Kitchen  EPINEPHrine 0.3 mg/0.3 mL IJ SOAJ injection Inject 0.3 mg into the muscle.    . famotidine (PEPCID) 40 MG tablet Take 40 mg by mouth daily.    Marland Kitchen L-Lysine 1000 MG TABS Take by mouth.    . Misc Natural Products (OSTEO BI-FLEX JOINT SHIELD PO) Take by mouth.    . mometasone (ELOCON) 0.1 % cream Can apply to skin once daily if needed. 90 g 1  . montelukast (SINGULAIR) 10 MG tablet Take 1 tablet (10 mg total) by mouth at bedtime. 90 tablet 1  . morphine (MSIR) 15 MG tablet TK 1 T PO QID PRN  0  . nebivolol (BYSTOLIC) 5 MG tablet Take 10 mg by mouth.     . Nutritional Supplements (DHEA PO) Take 5 mg by mouth daily.    . OXcarbazepine (TRILEPTAL) 150 MG tablet Take 300 mg by mouth.    . Pantoprazole Sodium (PROTONIX PO) Take by mouth every morning.    Marland Kitchen PRESCRIPTION MEDICATION Take 5 mg by mouth daily. Bi-Est (20:80)    . Progesterone Micronized (PROGESTERONE PO) Take by mouth daily.    . RABEprazole (ACIPHEX) 20 MG tablet Take 20 mg by mouth daily.    . Rizatriptan Benzoate (MAXALT PO) Take by mouth as needed.    . tacrolimus (PROTOPIC) 0.1 % ointment Apply topically.    Marland Kitchen telmisartan (MICARDIS) 40 MG tablet Take 40 mg by mouth as needed. As needed    . traMADol (ULTRAM) 50 MG tablet Take 50 mg by mouth 3 (three) times daily.    Marland Kitchen  triamcinolone (NASACORT) 55 MCG/ACT AERO nasal inhaler Place 1 spray into the nose daily.     . TURMERIC PO Take by mouth.    . cycloSPORINE (RESTASIS) 0.05 % ophthalmic emulsion 1 drop 2 (two) times daily.    Marland Kitchen FLECTOR 1.3 % PTCH UNW AND APP 1 PA  BID  1   No facility-administered medications prior to visit.    PAST MEDICAL HISTORY: Past Medical History:  Diagnosis Date  . Acid reflux   . ADD (attention deficit disorder)   . Anemia   . Arthritis   . Barrett's esophagus   . Bee sting allergy   . Chronic constipation   . Clostridium difficile carrier   . Depression   . Fatigue   . Genital herpes   . GERD (gastroesophageal reflux disease)   . Hypertension     . IBS (irritable bowel syndrome)   . Migraine headache     PAST SURGICAL HISTORY: Past Surgical History:  Procedure Laterality Date  . BREAST REDUCTION SURGERY  2018  . CESAREAN SECTION     Three Cesarean sections  . ESOPHAGOGASTRODUODENOSCOPY ENDOSCOPY    . HERNIA REPAIR Right    inguinal  . PARTIAL HYSTERECTOMY    . REDUCTION MAMMAPLASTY Bilateral 2018  . SPINAL CORD STIMULATOR IMPLANT  04/12/2016    FAMILY HISTORY: Family History  Problem Relation Age of Onset  . Hypertension Mother   . Alzheimer's disease Mother   . Cancer Father        Lung  . Heart disease Sister   . Alzheimer's disease Paternal Grandmother   . Heart disease Paternal Grandfather   . Breast cancer Maternal Aunt   . Heart attack Sister     SOCIAL HISTORY: Social History   Socioeconomic History  . Marital status: Married    Spouse name: Barnabas Lister   . Number of children: 3  . Years of education: 53  . Highest education level: Not on file  Occupational History  . Occupation: Press photographer / DESIGN     Employer: [employer]    Comment: [employer]   Tobacco Use  . Smoking status: Former Smoker    Packs/day: 2.00    Years: 14.00    Pack years: 28.00    Types: Cigarettes  . Smokeless tobacco: Never Used  Substance and Sexual Activity  . Alcohol use: Yes    Comment: Occasional   . Drug use: No  . Sexual activity: Yes    Partners: Male  Other Topics Concern  . Not on file  Social History Narrative   Marital Status: Married Barnabas Lister)   Children:  G 4 P 3013   Pets:  None    Living Situation: Lives with spouse and mother.     Occupation: Art gallery manager   Alcohol Use:  Occasional    Drug Use:  None   Tobacco Use:  Never    Diet:  Regular   Exercise:  Limited    Hobbies: Cooking   Social Determinants of Radio broadcast assistant Strain:   . Difficulty of Paying Living Expenses: Not on file  Food Insecurity:   . Worried About Charity fundraiser in  the Last Year: Not on file  . Ran Out of Food in the Last Year: Not on file  Transportation Needs:   . Lack of Transportation (Medical): Not on file  . Lack of Transportation (Non-Medical): Not on file  Physical Activity:   . Days  of Exercise per Week: Not on file  . Minutes of Exercise per Session: Not on file  Stress:   . Feeling of Stress : Not on file  Social Connections:   . Frequency of Communication with Friends and Family: Not on file  . Frequency of Social Gatherings with Friends and Family: Not on file  . Attends Religious Services: Not on file  . Active Member of Clubs or Organizations: Not on file  . Attends Archivist Meetings: Not on file  . Marital Status: Not on file  Intimate Partner Violence:   . Fear of Current or Ex-Partner: Not on file  . Emotionally Abused: Not on file  . Physically Abused: Not on file  . Sexually Abused: Not on file     PHYSICAL EXAM  GENERAL EXAM/CONSTITUTIONAL: Vitals:  Vitals:   05/04/19 1503  BP: (!) 157/83  Pulse: 79  Temp: (!) 96.8 F (36 C)  Weight: 180 lb 3.2 oz (81.7 kg)  Height: 5' (1.524 m)     Body mass index is 35.19 kg/m. Wt Readings from Last 3 Encounters:  05/04/19 180 lb 3.2 oz (81.7 kg)  07/16/16 144 lb 9.6 oz (65.6 kg)  11/22/13 141 lb (64 kg)     Patient is in no distress; well developed, nourished and groomed; neck is supple  CARDIOVASCULAR:  Examination of carotid arteries is normal; no carotid bruits  Regular rate and rhythm, no murmurs  Examination of peripheral vascular system by observation and palpation is normal  EYES:  Ophthalmoscopic exam of optic discs and posterior segments is normal; no papilledema or hemorrhages  No exam data present  MUSCULOSKELETAL:  Gait, strength, tone, movements noted in Neurologic exam below  NEUROLOGIC: MENTAL STATUS:  MMSE - Mini Mental State Exam 05/04/2019  Orientation to time 3  Orientation to Place 5  Registration 3  Attention/  Calculation 4  Recall 3  Language- name 2 objects 2  Language- repeat 1  Language- follow 3 step command 3  Language- read & follow direction 1  Write a sentence 1  Copy design 0  Total score 26    awake, alert, oriented to person, place and time  recent and remote memory intact  normal attention and concentration  language fluent, comprehension intact, naming intact  fund of knowledge appropriate  CRANIAL NERVE:   2nd - no papilledema on fundoscopic exam  2nd, 3rd, 4th, 6th - pupils equal and reactive to light, visual fields full to confrontation, extraocular muscles intact, no nystagmus  5th - facial sensation symmetric  7th - facial strength symmetric  8th - hearing intact  9th - palate elevates symmetrically, uvula midline  11th - shoulder shrug symmetric  12th - tongue protrusion midline  MOTOR:   normal bulk and tone, full strength in the BUE, BLE  SENSORY:   normal and symmetric to light touch, temperature, vibration  COORDINATION:   finger-nose-finger, fine finger movements normal  REFLEXES:   deep tendon reflexes present and symmetric  GAIT/STATION:   narrow based gait     DIAGNOSTIC DATA (LABS, IMAGING, TESTING) - I reviewed patient records, labs, notes, testing and imaging myself where available.  Lab Results  Component Value Date   WBC 6.1 11/05/2013   HGB 13.5 11/05/2013   HCT 40.6 11/05/2013   MCV 94.2 11/05/2013   PLT 295 11/05/2013      Component Value Date/Time   NA 142 11/05/2013 1503   K 3.9 11/05/2013 1503   CL 108 11/05/2013 1503  CO2 21 11/05/2013 1503   GLUCOSE 109 (H) 11/05/2013 1503   BUN 21 11/05/2013 1503   CREATININE 0.90 11/05/2013 1503   CREATININE 0.87 07/21/2013 1150   CALCIUM 9.3 11/05/2013 1503   PROT 6.7 07/21/2013 1150   ALBUMIN 4.5 07/21/2013 1150   AST 17 07/21/2013 1150   ALT 14 07/21/2013 1150   ALKPHOS 32 (L) 07/21/2013 1150   BILITOT 0.5 07/21/2013 1150   GFRNONAA 69 (L) 11/05/2013  1503   GFRNONAA 74 07/21/2013 1150   GFRAA 80 (L) 11/05/2013 1503   GFRAA 86 07/21/2013 1150   Lab Results  Component Value Date   CHOL 187 07/21/2013   HDL 53 07/21/2013   LDLCALC 115 (H) 07/21/2013   TRIG 93 07/21/2013   CHOLHDL 3.5 07/21/2013   No results found for: HGBA1C No results found for: VITAMINB12 Lab Results  Component Value Date   TSH 1.626 07/21/2013    07/02/18 MRI brain [I reviewed images myself and agree with interpretation. -VRP]  - normal  12/30/17 Neuropsychology testing (Dr. Si Raider) Clinical Impressions: Mild cognitive impairment.  Results of cognitive testing were largely within normal limits. However, she did demonstrate some impaired performances. Specifically, confrontation naming and deductive reasoning were impaired. Additionally, encoding/retrieval was impaired on a list learning task but recognition was intact.  Her test results do not indicate dementia, but a diagnosis of Mild Cognitive Impairment is warranted. At this time I cannot rule out an underlying neurodegenerative process like Alzheimer's disease (especially given cognitive profile and family history). There is not clear evidence of a primary psychiatric disorder. She does have a history of psychological trauma and somatic complaints, increasing the probability of a somatization disorder or a tendency for psychological distress to manifest in physical/cognitive symptoms. Additionally, chronic pain can negatively affect cognitive functioning in daily life. Of note, I do not see clear evidence of underlying ADHD and I suspect childhood attention difficulties were more due to chronic stress/trauma.    ASSESSMENT AND PLAN  64 y.o. year old female here with mild memory changes, may represent mild cognitive impairment.  Also with concomitant chronic pain, anxiety, GI complaints.  Also with history of prior traumatic experiences.  Dx:  1. Mild cognitive impairment   2. Memory change        PLAN:  MEMORY LOSS (MCI; likely related to ADD, pain, depression, anxiety; possible OSA) - continue tx for anxiety, pain, ADD; consider psychiatry / psychology evaluation - continue brain healthy activities  Return for pending if symptoms worsen or fail to improve, return to PCP.    Penni Bombard, MD [id number], [id number] PM Certified in Neurology, Neurophysiology and Neuroimaging   [facility] [address], [place], [place] [address] [phone]

## 2020-04-04 ENCOUNTER — Encounter: Payer: Self-pay | Admitting: Dermatology

## 2020-04-04 ENCOUNTER — Ambulatory Visit (INDEPENDENT_AMBULATORY_CARE_PROVIDER_SITE_OTHER): Payer: TRICARE For Life (TFL) | Admitting: Dermatology

## 2020-04-04 ENCOUNTER — Other Ambulatory Visit: Payer: Self-pay

## 2020-04-04 DIAGNOSIS — C44729 Squamous cell carcinoma of skin of left lower limb, including hip: Secondary | ICD-10-CM

## 2020-04-04 DIAGNOSIS — D485 Neoplasm of uncertain behavior of skin: Secondary | ICD-10-CM

## 2020-04-04 MED ORDER — MUPIROCIN 2 % EX OINT: 1.0000 "application " | TOPICAL_OINTMENT | Freq: Two times a day (BID) | CUTANEOUS | 0 refills | Status: AC

## 2020-04-04 NOTE — Patient Instructions (Signed)

## 2020-04-09 ENCOUNTER — Encounter: Payer: Self-pay | Admitting: Dermatology

## 2020-04-09 NOTE — Progress Notes (Signed)
   Follow-Up Visit   Subjective  Claudia Mitchell is a 64 y.o. female who presents for the following: Skin Problem (x2 months left shin larger and nonhealing).  Growth Location: Left leg Duration:  Quality:  Associated Signs/Symptoms: Modifying Factors:  Severity:  Timing: Context:   Objective  Well appearing patient in no apparent distress; mood and affect are within normal limits. Left Lower Leg - Anterior       A focused examination was performed including Head, neck, arms, legs.. Relevant physical exam findings are noted in the Assessment and Plan.   Assessment & Plan    Neoplasm of uncertain behavior of skin Left Lower Leg - Anterior  Skin / nail biopsy Type of biopsy: tangential   Informed consent: discussed and consent obtained   Timeout: patient name, date of birth, surgical site, and procedure verified   Anesthesia: the lesion was anesthetized in a standard fashion   Anesthetic:  1% lidocaine w/ epinephrine 1-100,000 local infiltration Instrument used: flexible razor blade   Hemostasis achieved with: aluminum chloride and electrodesiccation   Outcome: patient tolerated procedure well   Post-procedure details: wound care instructions given    Specimen 1 - Surgical pathology Differential Diagnosis: bcc scc ka  Check Margins: No      I, Claudia Monarch, MD, have reviewed all documentation for this visit.  The documentation on 04/09/20 for the exam, diagnosis, procedures, and orders are all accurate and complete.

## 2020-04-10 ENCOUNTER — Telehealth: Payer: Self-pay | Admitting: *Deleted

## 2020-04-10 NOTE — Telephone Encounter (Signed)
-----   Message from Lavonna Monarch, MD sent at 04/07/2020  9:12 AM EST ----- Because of delay to schedule Mohs schedule for surgery with me and I will discuss this.

## 2020-04-10 NOTE — Telephone Encounter (Signed)
Path to patient. Made surgery appointment with Dr.Tafeen.  

## 2020-05-01 ENCOUNTER — Other Ambulatory Visit: Payer: Self-pay | Admitting: Family Medicine

## 2020-05-01 DIAGNOSIS — Z1231 Encounter for screening mammogram for malignant neoplasm of breast: Secondary | ICD-10-CM

## 2020-05-05 ENCOUNTER — Encounter: Payer: Self-pay | Admitting: Dermatology

## 2020-05-05 ENCOUNTER — Other Ambulatory Visit: Payer: Self-pay

## 2020-05-05 ENCOUNTER — Ambulatory Visit (INDEPENDENT_AMBULATORY_CARE_PROVIDER_SITE_OTHER): Payer: TRICARE For Life (TFL) | Admitting: Dermatology

## 2020-05-05 DIAGNOSIS — Z8589 Personal history of malignant neoplasm of other organs and systems: Secondary | ICD-10-CM

## 2020-05-05 DIAGNOSIS — L858 Other specified epidermal thickening: Secondary | ICD-10-CM | POA: Diagnosis not present

## 2020-05-05 DIAGNOSIS — L719 Rosacea, unspecified: Secondary | ICD-10-CM

## 2020-05-05 DIAGNOSIS — Z85828 Personal history of other malignant neoplasm of skin: Secondary | ICD-10-CM

## 2020-05-05 MED ORDER — METRONIDAZOLE 0.75 % EX LOTN: 1.0000 "application " | TOPICAL_LOTION | Freq: Every day | CUTANEOUS | 2 refills | Status: AC

## 2020-05-05 NOTE — Patient Instructions (Signed)
Mohs Surgery Mohs surgery is a procedure used to treat skin cancer. It is often used to treat common types of skin cancer, such as basal cell carcinoma and squamous cell carcinoma. In this procedure, cancerous skin cells are carefully cut away layer by layer. The goal is to remove all cancerous tissue and leave healthy skin. This reduces scarring and allows for a better cosmetic outcome. Mohs surgery is used to treat skin cancer in areas where it is important to save as much of the normal skin as possible. These areas include the face, nose, ears, lips, and genitals. This procedure may be done if:  Your skin cancer has returned after another type of treatment was done.  The cancer is likely to return.  The cancerous area is large.  The cancerous area has edges that are not clearly defined.  The cancer is growing rapidly. Tell a health care provider about:  Any allergies you have.  All medicines you are taking, including vitamins, herbs, eye drops, creams, and over-the-counter medicines.  Any problems you or family members have had with anesthetic medicines.  Any blood disorders you have.  Any surgeries you have had.  Any medical conditions you have.  Whether you are pregnant or may be pregnant. What are the risks? Generally, this is a safe procedure. However, problems may occur, including:  Infection.  Bleeding.  Allergic reactions to medicines.  Damage to other structures, such as nerves. What happens before the procedure?  Ask your health care provider about: ? Changing or stopping your regular medicines. This is especially important if you are taking diabetes medicines or blood thinners. ? Taking medicines such as aspirin and ibuprofen. These medicines can thin your blood. Do not take these medicines unless your health care provider tells you to take them. ? Taking over-the-counter medicines, vitamins, herbs, and supplements.  Ask your health care provider how your  surgical site will be marked or identified.  Ask your health care provider what steps will be taken to help prevent infection. These may include: ? Removing hair at the surgery site. ? Washing skin with a germ-killing soap. ? Taking antibiotic medicine. What happens during the procedure?  You will be given a medicine to numb the area (local anesthetic).  A layer of cancerous tissue will be removed with a scalpel. The layer removed will contain a small amount of the healthy tissue surrounding the cancerous tissue.  The layer of removed tissue will be checked right away under a microscope. The surgeon will note the exact location of the cancerous cells.  Another layer of tissue may be removed from an area with any remaining cancerous cells. This layer will be checked in the same way.  More layers of cancerous tissue may be removed, one by one, and checked until no signs of cancer remain.  Depending on the size and location of the surgical wound, it may be closed with stitches (sutures) or left open to heal on its own. In some cases, a skin flap or skin graft may be needed.  A bandage (dressing) will be applied to the area. The procedure may vary among health care providers and hospitals.   What happens after the procedure?  Return to your normal activities as told by your health care provider. Summary  Mohs surgery is a procedure used to treat skin cancer on the face, ears, nose, lips, and genitals. It removes the cancerous cells while leaving as much healthy skin as possible.  Generally, this is a   safe procedure. However, problems may occur, including infection, bleeding, and damage to other structures, such as nerves.  Follow your health care provider's instructions before the procedure. You may be asked to change or stop certain medicines.  After the procedure, you may return to your normal activities as told by your health care provider. This information is not intended to replace  advice given to you by your health care provider. Make sure you discuss any questions you have with your health care provider. Document Revised: 11/04/2017 Document Reviewed: 11/04/2017 Elsevier Patient Education  [place].

## 2020-05-05 NOTE — Progress Notes (Signed)
Leg needs MOHS surgery

## 2020-05-09 ENCOUNTER — Encounter: Payer: Self-pay | Admitting: Dermatology

## 2020-05-09 NOTE — Progress Notes (Addendum)
   Follow-Up Visit   Subjective  Claudia Mitchell is a 65 y.o. female who presents for the following: Procedure (Check left lower leg).  SCCA Location: Left leg duration:  Quality:  Associated Signs/Symptoms: Modifying Factors:  Severity:  Timing: Context: Several new crusts on legs  Objective  Well appearing patient in no apparent distress; mood and affect are within normal limits. Objective  Head - Anterior (Face): Central facial erythema and inflammatory papules.  Objective  Left Lower Leg - Anterior: Soriatane 10 mg possible new start no prescription today. Also patient stated she is taking MTX and she has hair shedding with the dose she is on x6 a week, Dr Lytle Butte   Objective  Right Lower Leg - Anterior: In addition to the biopsy-proven large open painful lesion on the left shin, there is a 5 mm volcano shaped new nodule on the right shin.  There are 2 smaller crusts (1 on each leg) which could represent early KA's.  I am concerned that Claudia Mitchell is evolving into an individual prone to get multiple KAs, and particularly on the legs I would like to try to minimize future procedures, particularly on the lower legs.  We discussed in some detail the possible benefits of long-term Soriatane, including the risk of hair shedding with this treatment.  She will think this over before we initiate this and we will initially get the left leg taken care of with Mohs surgery.   A focused examination was performed including Head, neck, arms, legs.. Relevant physical exam findings are noted in the Assessment and Plan.   Assessment & Plan    Rosacea Head - Anterior (Face)  Topical metronidazole.  If this is not helping after the first prescription, I have asked Claudia Mitchell to call me to discuss treatment alternatives.  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  The documentation on 05/09/20 for the exam, diagnosis, procedures, and orders are all accurate and complete.

## 2020-06-12 ENCOUNTER — Ambulatory Visit: Payer: TRICARE For Life (TFL)

## 2020-06-15 ENCOUNTER — Encounter: Payer: TRICARE For Life (TFL) | Admitting: Dermatology

## 2020-06-28 ENCOUNTER — Inpatient Hospital Stay: Admission: RE | Admit: 2020-06-28 | Payer: TRICARE For Life (TFL) | Source: Ambulatory Visit

## 2020-07-05 ENCOUNTER — Other Ambulatory Visit: Payer: Self-pay

## 2020-07-05 ENCOUNTER — Encounter: Payer: Self-pay | Admitting: Physician Assistant

## 2020-07-05 ENCOUNTER — Ambulatory Visit (INDEPENDENT_AMBULATORY_CARE_PROVIDER_SITE_OTHER): Payer: TRICARE For Life (TFL) | Admitting: Physician Assistant

## 2020-07-05 ENCOUNTER — Telehealth: Payer: Self-pay | Admitting: *Deleted

## 2020-07-05 DIAGNOSIS — D229 Melanocytic nevi, unspecified: Secondary | ICD-10-CM

## 2020-07-05 DIAGNOSIS — L570 Actinic keratosis: Secondary | ICD-10-CM

## 2020-07-05 DIAGNOSIS — L578 Other skin changes due to chronic exposure to nonionizing radiation: Secondary | ICD-10-CM

## 2020-07-05 DIAGNOSIS — Z1283 Encounter for screening for malignant neoplasm of skin: Secondary | ICD-10-CM | POA: Diagnosis not present

## 2020-07-05 DIAGNOSIS — L309 Dermatitis, unspecified: Secondary | ICD-10-CM

## 2020-07-05 DIAGNOSIS — D0472 Carcinoma in situ of skin of left lower limb, including hip: Secondary | ICD-10-CM

## 2020-07-05 DIAGNOSIS — D2262 Melanocytic nevi of left upper limb, including shoulder: Secondary | ICD-10-CM | POA: Diagnosis not present

## 2020-07-05 DIAGNOSIS — D049 Carcinoma in situ of skin, unspecified: Secondary | ICD-10-CM

## 2020-07-05 MED ORDER — TRIAMCINOLONE ACETONIDE 0.1 % EX CREA: 1.0000 "application " | TOPICAL_CREAM | Freq: Two times a day (BID) | CUTANEOUS | 11 refills | Status: AC | PRN

## 2020-07-05 NOTE — Progress Notes (Signed)
   Follow-Up Visit   Subjective  Claudia Mitchell is a 65 y.o. female who presents for the following: Annual Exam (Full body skin check. Had previous MOHS surgery, going tomorrow to have the graph done. ).   The following portions of the chart were reviewed this encounter and updated as appropriate:  Tobacco  Allergies  Meds  Problems  Med Hx  Surg Hx  Fam Hx      Objective  Well appearing patient in no apparent distress; mood and affect are within normal limits.  A full examination was performed including scalp, head, eyes, ears, nose, lips, neck, chest, axillae, abdomen, back, buttocks, bilateral upper extremities, bilateral lower extremities, hands, feet, fingers, toes, fingernails, and toenails. All findings within normal limits unless otherwise noted below.  Objective  Left Inguinal Area: Full body skin exam. Per KS not going to do anything to lower legs until skin graph is completed and left leg is healed.   No atypical moles found.  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 clear  Objective  arms and legs, trunk: Erythema and xerosis arms and legs> trunk.  Objective  Left Lower Leg - Anterior: Large postsurgical defect with granulation tissue and no signs of infection.     Objective  Left Lower Leg - Anterior, Left Thigh - Anterior, Right Knee - Anterior, Right Thigh - Anterior: Erythematous patches with gritty scale.  Assessment & Plan  Screening exam for skin cancer Left Inguinal Area  To leave everything  Sun-damaged skin both legs and arms  Observe until graft has healed  Atypical mole Left Shoulder - Posterior  Eczema, unspecified type arms and legs, trunk  Discontinue tanning bed with new onset large keratoacanthoma  on her left shin. She has skin graft appointment 07/06/20 with Dr. Link Snuffer.  Obtain approval for dupixent.  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 Link Snuffer for skin graft.  Multiple actinic keratoses (4) Right Knee - Anterior; Left Thigh - Anterior; Right Thigh - Anterior; Left Lower Leg - Anterior  Observe for now. Pt. To call if any of them grow quickly.   I, Sibley Rolison, PA-C, have reviewed all documentation's for this visit.  The documentation on 07/05/20 for the exam, diagnosis, procedures and orders are all accurate and complete.

## 2020-07-05 NOTE — Patient Instructions (Signed)
Dupilumab injection What is this medicine? DUPILUMAB (doo PIL ue mab) is an injection used to treat certain patients with eczema, asthma, and sinus inflammation with nasal polyps. This medicine may be used for other purposes; ask your health care provider or pharmacist if you have questions. COMMON BRAND NAME(S): DUPIXENT What should I tell my health care provider before I take this medicine? They need to know if you have any of these conditions:  asthma  parasitic (helminth) infection  an unusual or allergic reaction to dupilumab, other medicines, foods, dyes, or preservatives  pregnant or trying to get pregnant  breast-feeding How should I use this medicine? This medicine is for injection under the skin. You will be taught how to prepare and give this medicine. Use exactly as directed. Take your medicine at regular intervals. Do not take your medicine more often than directed. It is important that you put your used needles and syringes in a special sharps container. Do not put them in a trash can. If you do not have a sharps container, call your pharmacist or healthcare provider to get one. Talk to your pediatrician regarding the use of this medicine in children. While this medicine may be prescribed for children as young as 6 years for selected conditions, precautions do apply. Overdosage: If you think you have taken too much of this medicine contact a poison control center or emergency room at once. NOTE: This medicine is only for you. Do not share this medicine with others. What if I miss a dose? If you miss a dose, take it as soon as you can if it is within 7 days from the missed dose. If it is more than 7 days from your last dose and you are on an every other week dosing schedule, skip the dose and wait to take the next dose on the original schedule. If it is more than 7 days from your last dose and you are on an every 4-week dosing schedule, take a dose as soon as possible and start a  new schedule based on this date. Do not take double or extra doses. What may interact with this medicine?  vaccines  warfarin This list may not describe all possible interactions. Give your health care provider a list of all the medicines, herbs, non-prescription drugs, or dietary supplements you use. Also tell them if you smoke, drink alcohol, or use illegal drugs. Some items may interact with your medicine. What should I watch for while using this medicine? Tell your doctor or healthcare professional if your symptoms do not start to get better or if they get worse. You should not receive certain vaccines while using this medicine. Dupilumab may prevent a vaccine from working. Talk to your health care provider if you need to receive a vaccine while using this medicine. What side effects may I notice from receiving this medicine? Side effects that you should report to your doctor or health care professional as soon as possible:  allergic reactions like skin rash, itching or hives, swelling of the face, lips, or tongue  changes in vision  eye pain or swelling Side effects that usually do not require medical attention (report these to your doctor or health care professional if they continue or are bothersome):  cold sores  dry or itching eyes  pain, redness, or irritation at site where injected This list may not describe all possible side effects. Call your doctor for medical advice about side effects. You may report side effects to FDA  at 1-800-FDA-1088. Where should I keep my medicine? Keep out of the reach of children. Store this medicine in the refrigerator between 2 and 8 degrees C (36 and 46 degrees F) until you are ready to prepare your injection; do not freeze. You may also store at room temperature for up to 14 days if needed. Do not store above 25 degrees C (77 degrees F) or expose to heat. Throw away any unused medicine after the expiration date. NOTE: This sheet is a summary.  It may not cover all possible information. If you have questions about this medicine, talk to your doctor, pharmacist, or health care provider.  2021 Elsevier/Gold Standard (2018-10-12 11:55:26)

## 2020-07-05 NOTE — Telephone Encounter (Signed)
[employer] [place]- sent sender[facility] form to [place] @ [phone] and to [place] my way at [phone]

## 2020-07-06 NOTE — Telephone Encounter (Signed)
This encounter was created in error - please disregard.

## 2020-07-06 NOTE — Telephone Encounter (Signed)
Fax received from [facility] stating that the did receive the patient's prescription and they are verifying the patient's insurance information.

## 2020-07-10 ENCOUNTER — Telehealth: Payer: Self-pay | Admitting: Physician Assistant

## 2020-07-10 NOTE — Telephone Encounter (Signed)
Wants to know if prior Josem Kaufmann is in for dupixent

## 2020-07-11 NOTE — Telephone Encounter (Signed)
Called patient to let patient know that [facility] has received prescription and are verifying patients insurance.  Informed patient to call us back in 1 week or so to check status of Dupixent.

## 2020-07-17 ENCOUNTER — Telehealth: Payer: Self-pay | Admitting: *Deleted

## 2020-07-17 NOTE — Telephone Encounter (Signed)
Received approval for Rx. Dupixent- approved 06/12/2020-07-12-2021   PA # [id number]

## 2020-08-16 ENCOUNTER — Telehealth: Payer: Self-pay | Admitting: Physician Assistant

## 2020-08-16 NOTE — Telephone Encounter (Signed)
Dupixent was approved weeks ago and she wants to know what's supposed to happen now in terms of who orders it, when is the training, is she supposed to do anything, etc

## 2020-08-16 NOTE — Telephone Encounter (Signed)
Called patient and patient states she still hasn't received shipment for dupixent- told patient she needs to call [employer] to schedule shipment- informed patient to call us if needed.

## 2020-08-21 ENCOUNTER — Ambulatory Visit: Payer: TRICARE For Life (TFL)

## 2020-08-23 ENCOUNTER — Telehealth: Payer: Self-pay | Admitting: Physician Assistant

## 2020-08-23 DIAGNOSIS — L309 Dermatitis, unspecified: Secondary | ICD-10-CM

## 2020-08-23 NOTE — Telephone Encounter (Signed)
Phone call to [facility] to see what's going on with the patient's prescription since we sent the prescription for the patient's Dupixent to them?  Per Maudie Mercury with [facility] the patient's insurance request that the prescription needs to be filled by [place].  Per Maudie Mercury she will refax the information to [place] for [place] to send the patient's prescription.

## 2020-08-23 NOTE — Telephone Encounter (Signed)
Patient left message on office voice mail that she called [facility] about her [place] and was told that a new prescription needed to be sent in before they could fill her prescription and that the [place] was already approved by her insurance-[employer].

## 2020-08-23 NOTE — Telephone Encounter (Signed)
This encounter was created in error - please disregard.

## 2020-08-31 ENCOUNTER — Ambulatory Visit (INDEPENDENT_AMBULATORY_CARE_PROVIDER_SITE_OTHER): Payer: TRICARE For Life (TFL) | Admitting: Physician Assistant

## 2020-08-31 ENCOUNTER — Other Ambulatory Visit: Payer: Self-pay

## 2020-08-31 ENCOUNTER — Encounter: Payer: Self-pay | Admitting: Physician Assistant

## 2020-08-31 DIAGNOSIS — L209 Atopic dermatitis, unspecified: Secondary | ICD-10-CM | POA: Diagnosis not present

## 2020-08-31 MED ORDER — DUPIXENT 300 MG/2ML ~~LOC~~ SOSY: 300.0000 mg | PREFILLED_SYRINGE | SUBCUTANEOUS | 6 refills | Status: DC

## 2020-08-31 MED ORDER — DUPIXENT 300 MG/2ML ~~LOC~~ SOAJ: 600.0000 mg | Freq: Once | SUBCUTANEOUS | 0 refills | Status: AC

## 2020-08-31 NOTE — Progress Notes (Signed)
Initial does of Dupixent given to patient in office.   300 mg injected in left thigh 300 mg injected in right thigh  [id number] EXP-2022-02-19 [id number] Patient tolerated well no concerns.

## 2020-09-06 ENCOUNTER — Other Ambulatory Visit: Payer: Self-pay

## 2020-09-06 DIAGNOSIS — L209 Atopic dermatitis, unspecified: Secondary | ICD-10-CM

## 2020-09-06 MED ORDER — DUPIXENT 300 MG/2ML ~~LOC~~ SOAJ: 300.0000 mg | SUBCUTANEOUS | 6 refills | Status: DC

## 2020-09-07 ENCOUNTER — Telehealth: Payer: Self-pay | Admitting: Physician Assistant

## 2020-09-07 NOTE — Telephone Encounter (Signed)
Patient left message on office voice mail that she received a shipment of [place] today.

## 2020-09-12 ENCOUNTER — Encounter: Payer: Self-pay | Admitting: Physician Assistant

## 2020-09-12 NOTE — Progress Notes (Signed)
   Follow-Up Visit   Subjective  Claudia Mitchell is a 65 y.o. female who presents for the following: Follow-up (F/u- left lower leg- Mohs on leg and skin graft x 2 ). Healing well. Her eczema is flaring, itching and red.She has used topical steroid creams as well as tacrolimus. They aren't helping right now.  Per review of chart she has never had a bacterial culture performed to rule out S. Aureus.   The following portions of the chart were reviewed this encounter and updated as appropriate:  Tobacco  Allergies  Meds  Problems  Med Hx  Surg Hx  Fam Hx      Objective  Well appearing patient in no apparent distress; mood and affect are within normal limits.  A full examination was performed including scalp, head, eyes, ears, nose, lips, neck, chest, axillae, abdomen, back, buttocks, bilateral upper extremities, bilateral lower extremities, hands, feet, fingers, toes, fingernails, and toenails. All findings within normal limits unless otherwise noted below.  Objective  Chest - Medial [facility]), Left Thigh - Anterior, Left Upper Arm - Anterior, Mid Back, Right Thigh - Anterior, Right Upper Arm - Anterior: Ill-defined pink papules/plaques with scale-crust.    Assessment & Plan  Atopic dermatitis, unspecified type (6) Left Upper Arm - Anterior; Right Upper Arm - Anterior; Left Thigh - Anterior; Right Thigh - Anterior; Chest - Medial [facility]); Mid Back  Dupilumab (DUPIXENT) 300 MG/2ML SOPN - Chest - Medial (Center), Left Thigh - Anterior, Left Upper Arm - Anterior, Mid Back, Right Thigh - Anterior, Right Upper Arm - Anterior  Other Related Medications Dupilumab (DUPIXENT) 300 MG/2ML SOPN  Warned of side effects and benefits. Encouraged her to make sure she has an ophthomologist.  I, Markel Mergenthaler, PA-C, have reviewed all documentation's for this visit.  The documentation on 09/12/20 for the exam, diagnosis, procedures and orders are all accurate and complete.

## 2021-03-06 ENCOUNTER — Other Ambulatory Visit: Payer: Self-pay

## 2021-03-06 ENCOUNTER — Ambulatory Visit (INDEPENDENT_AMBULATORY_CARE_PROVIDER_SITE_OTHER): Payer: Medicare Other | Admitting: Physician Assistant

## 2021-03-06 ENCOUNTER — Other Ambulatory Visit: Payer: Self-pay | Admitting: Physician Assistant

## 2021-03-06 DIAGNOSIS — D485 Neoplasm of uncertain behavior of skin: Secondary | ICD-10-CM

## 2021-03-06 DIAGNOSIS — C4492 Squamous cell carcinoma of skin, unspecified: Secondary | ICD-10-CM

## 2021-03-06 DIAGNOSIS — Z1283 Encounter for screening for malignant neoplasm of skin: Secondary | ICD-10-CM | POA: Diagnosis not present

## 2021-03-06 DIAGNOSIS — L2089 Other atopic dermatitis: Secondary | ICD-10-CM

## 2021-03-06 DIAGNOSIS — D0471 Carcinoma in situ of skin of right lower limb, including hip: Secondary | ICD-10-CM

## 2021-03-06 HISTORY — DX: Squamous cell carcinoma of skin, unspecified: C44.92

## 2021-03-06 NOTE — Patient Instructions (Signed)

## 2021-03-07 ENCOUNTER — Encounter: Payer: Self-pay | Admitting: Physician Assistant

## 2021-03-07 NOTE — Progress Notes (Addendum)
Follow-Up Visit   Subjective  Claudia Mitchell is a 65 y.o. female who presents for the following: Follow-up (On patients atopic dermatitis. Using Dupixent. Patient thinks it is helping. She also said we were looking at some lesions to possibly remove.)   The following portions of the chart were reviewed this encounter and updated as appropriate:  Tobacco  Allergies  Meds  Problems  Med Hx  Surg Hx  Fam Hx      Objective  Well appearing patient in no apparent distress; mood and affect are within normal limits.  A full examination was performed including scalp, head, eyes, ears, nose, lips, neck, chest, axillae, abdomen, back, buttocks, bilateral upper extremities, bilateral lower extremities, hands, feet, fingers, toes, fingernails, and toenails. All findings within normal limits unless otherwise noted below.  Left Chest Pink crust with significant scale       Left Upper Arm - Anterior Dark plaque  Left Lower Leg - Anterior Erythema without scale  Right Lower Leg - Anterior Thick dense plaque           Assessment & Plan  Neoplasm of uncertain behavior of skin (2) Left Chest  Skin / nail biopsy Type of biopsy: tangential   Informed consent: discussed and consent obtained   Timeout: patient name, date of birth, surgical site, and procedure verified   Procedure prep:  Patient was prepped and draped in usual sterile fashion (Non sterile) Prep type:  Chlorhexidine Anesthesia: the lesion was anesthetized in a standard fashion   Anesthetic:  1% lidocaine w/ epinephrine 1-100,000 local infiltration Instrument used: flexible razor blade   Outcome: patient tolerated procedure well   Post-procedure details: wound care instructions given    Specimen 2 - Surgical pathology Differential Diagnosis: bcc vs scc  Check Margins: No  Left Upper Arm - Anterior  Skin / nail biopsy Type of biopsy: tangential   Informed consent: discussed and consent obtained    Timeout: patient name, date of birth, surgical site, and procedure verified   Procedure prep:  Patient was prepped and draped in usual sterile fashion (Non sterile) Prep type:  Chlorhexidine Anesthesia: the lesion was anesthetized in a standard fashion   Anesthetic:  1% lidocaine w/ epinephrine 1-100,000 local infiltration Instrument used: flexible razor blade   Outcome: patient tolerated procedure well   Post-procedure details: wound care instructions given    Specimen 3 - Surgical pathology Differential Diagnosis: r/o angioma  Check Margins: No  Other atopic dermatitis Left Lower Leg - Anterior  Continue Dupixent  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 Right Lower Leg - Anterior  Skin / nail biopsy Type of biopsy: tangential   Informed consent: discussed and consent obtained   Timeout: patient name, date of birth, surgical site, and procedure verified   Procedure prep:  Patient was prepped and draped in usual sterile fashion (Non sterile) Prep type:  Chlorhexidine Anesthesia: the lesion was anesthetized in a standard fashion   Anesthetic:  1% lidocaine w/ epinephrine 1-100,000 local infiltration Instrument used: flexible razor blade   Outcome: patient tolerated procedure well   Post-procedure details: wound care instructions given    Destruction of lesion Complexity: simple   Destruction method: electrodesiccation and curettage   Informed consent: discussed and consent obtained   Timeout:  patient name, date of birth, surgical site, and procedure verified Anesthesia: the lesion was anesthetized in a standard fashion   Anesthetic:  1% lidocaine w/ epinephrine 1-100,000 local infiltration Curettage performed in  three different directions: Yes   Electrodesiccation performed over the curetted area: Yes   Curettage cycles:  1 Margin per side (cm):  0.1 Final wound size (cm):  1 Hemostasis achieved with:  aluminum chloride Outcome: patient  tolerated procedure well with no complications   Post-procedure details: wound care instructions given    Specimen 1 - Surgical pathology Differential Diagnosis: bcc vs [id number]  Check Margins: No   I, Katoya Amato, PA-C, have reviewed all documentation's for this visit.  The documentation on 03/28/21 for the exam, diagnosis, procedures and orders are all accurate and complete.

## 2021-03-19 ENCOUNTER — Telehealth: Payer: Self-pay | Admitting: Physician Assistant

## 2021-03-19 ENCOUNTER — Other Ambulatory Visit: Payer: Self-pay | Admitting: Dermatology

## 2021-03-19 ENCOUNTER — Encounter: Payer: Self-pay | Admitting: Physician Assistant

## 2021-03-19 DIAGNOSIS — L209 Atopic dermatitis, unspecified: Secondary | ICD-10-CM

## 2021-03-19 NOTE — Telephone Encounter (Signed)
Biopsy results, KRS

## 2021-03-19 NOTE — Telephone Encounter (Signed)
Phone call from patient wanting her pathology results. Path to patient.

## 2021-03-26 ENCOUNTER — Other Ambulatory Visit: Payer: Self-pay | Admitting: *Deleted

## 2021-03-26 DIAGNOSIS — L209 Atopic dermatitis, unspecified: Secondary | ICD-10-CM

## 2021-03-26 MED ORDER — DUPIXENT 300 MG/2ML ~~LOC~~ SOAJ: 300.0000 mg | SUBCUTANEOUS | 6 refills | Status: DC

## 2021-03-27 ENCOUNTER — Telehealth: Payer: Self-pay

## 2021-03-27 NOTE — Telephone Encounter (Signed)
Path to patient  

## 2021-03-27 NOTE — Progress Notes (Signed)
Lesion was destructed. NTS if not healing well. Recheck 6 months

## 2021-03-27 NOTE — Telephone Encounter (Signed)
-----   Message from Warren Danes, [place] sent at 03/27/2021  1:35 PM EST ----- Lesion was destructed. NTS if not healing well. Recheck 6 months

## 2021-03-28 ENCOUNTER — Other Ambulatory Visit: Payer: Self-pay | Admitting: Gastroenterology

## 2021-03-28 DIAGNOSIS — R1310 Dysphagia, unspecified: Secondary | ICD-10-CM

## 2021-04-24 ENCOUNTER — Telehealth: Payer: Self-pay | Admitting: Physician Assistant

## 2021-04-24 DIAGNOSIS — L209 Atopic dermatitis, unspecified: Secondary | ICD-10-CM

## 2021-04-24 MED ORDER — DUPIXENT 300 MG/2ML ~~LOC~~ SOAJ: 300.0000 mg | SUBCUTANEOUS | 0 refills | Status: DC

## 2021-04-24 NOTE — Telephone Encounter (Signed)
Patient left message on office voice mail that she was having trouble getting her prescription for Dupixent due to [facility] orders being delayed.  Patient wants to know what she can do to get her prescription- can she get it from [facility] or here?

## 2021-04-24 NOTE — Telephone Encounter (Signed)
Phone call to patient to inform her that I can send her prescription to her local pharmacy and if they do not have the medication or her copay is to high then she can call me back and we have one sample she can get. Patient aware.

## 2021-04-25 ENCOUNTER — Other Ambulatory Visit: Payer: Self-pay | Admitting: Gastroenterology

## 2021-04-25 ENCOUNTER — Ambulatory Visit
Admission: RE | Admit: 2021-04-25 | Discharge: 2021-04-25 | Disposition: A | Discharge status: Home / Self Care | Payer: Medicare Other | Source: Ambulatory Visit | Attending: Gastroenterology | Admitting: Gastroenterology

## 2021-04-25 DIAGNOSIS — R1310 Dysphagia, unspecified: Secondary | ICD-10-CM

## 2021-06-18 ENCOUNTER — Other Ambulatory Visit: Payer: Self-pay | Admitting: Physician Assistant

## 2021-06-18 DIAGNOSIS — L209 Atopic dermatitis, unspecified: Secondary | ICD-10-CM

## 2021-06-25 ENCOUNTER — Telehealth: Payer: Self-pay | Admitting: Physician Assistant

## 2021-06-25 NOTE — Telephone Encounter (Signed)
Called patient to see what was going on with dupixent- everything done on our end- having delivery issues with [facility]. Informed patient that I will try to get sample from drug rep but that was about all I could do. Patient understood.  ?

## 2021-06-25 NOTE — Telephone Encounter (Signed)
Patient is calling to check on the status of her Dupixent.  Patient states that she still has not received the shipment. ?

## 2021-07-02 ENCOUNTER — Telehealth: Payer: Self-pay | Admitting: *Deleted

## 2021-07-02 NOTE — Telephone Encounter (Signed)
Prior authorization done via cover my meds for Dupixent.  ?Claudia Mitchell Key: [id number] - PA Case ID: [id number] ?

## 2021-08-08 ENCOUNTER — Telehealth: Payer: Self-pay | Admitting: Physician Assistant

## 2021-08-08 DIAGNOSIS — L209 Atopic dermatitis, unspecified: Secondary | ICD-10-CM

## 2021-08-08 MED ORDER — DUPIXENT 300 MG/2ML ~~LOC~~ SOAJ: SUBCUTANEOUS | 2 refills | Status: AC

## 2021-08-08 NOTE — Telephone Encounter (Signed)
[facility] told her a double order of Dupixent would cost her the same as a single, 2 months of med for the price of 1.Would like the Rx rewritten for this ?

## 2021-08-08 NOTE — Telephone Encounter (Signed)
Phone call to patient to let her know we sent in the 2 month supply of her Dupixent. ?

## 2022-07-24 IMAGING — RF DG ESOPHAGUS
8 series · 14 of 24 positions shown · non-contrast
Comparison: Prior study 03/03/2018

CLINICAL DATA: History or Barrett's esophagus

EXAM:
ESOPHAGUS/BARIUM SWALLOW/TABLET STUDY
TECHNIQUE: Combined double and single contrast examination was performed using
effervescent crystals, high-density barium, and thin liquid barium.
This exam was performed by Rischl, Pesthy, and was supervised
and interpreted by Knuth. Mir.
FLUOROSCOPY TIME:  Radiation Exposure Index (as provided by the
fluoroscopic device): [id number]
Number of Acquired Images: Multiple cine-imaging performed

[Series 1: sequence · 2 of 24 frames shown (1 of 6)]
[frame 4/24]
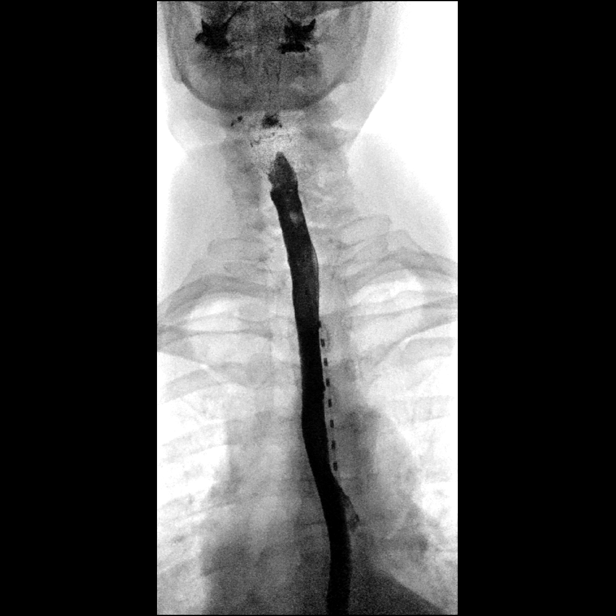
[frame 14/24]
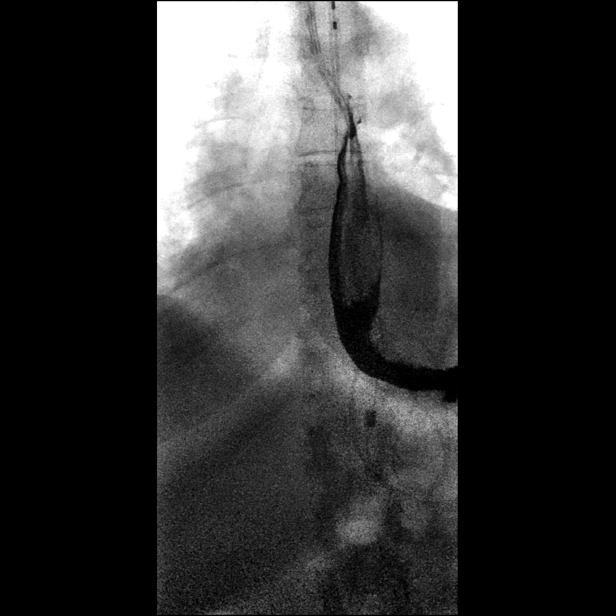

[Series 2: sequence · 2 of 27 frames shown (2 of 6)]
[frame 14/27]
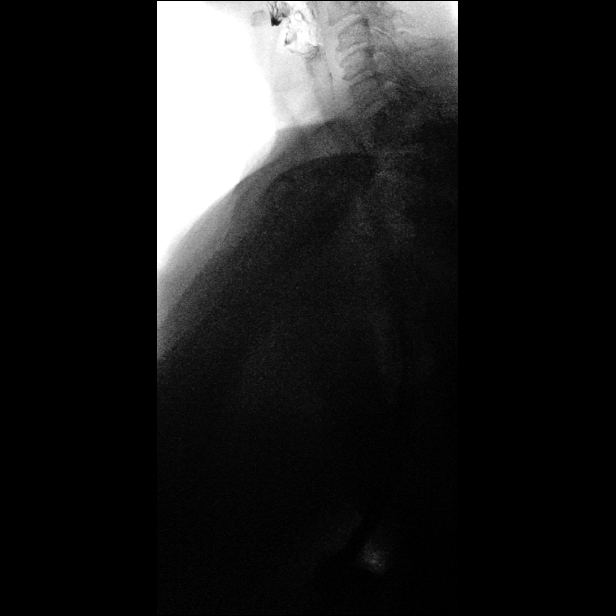
[frame 23/27]
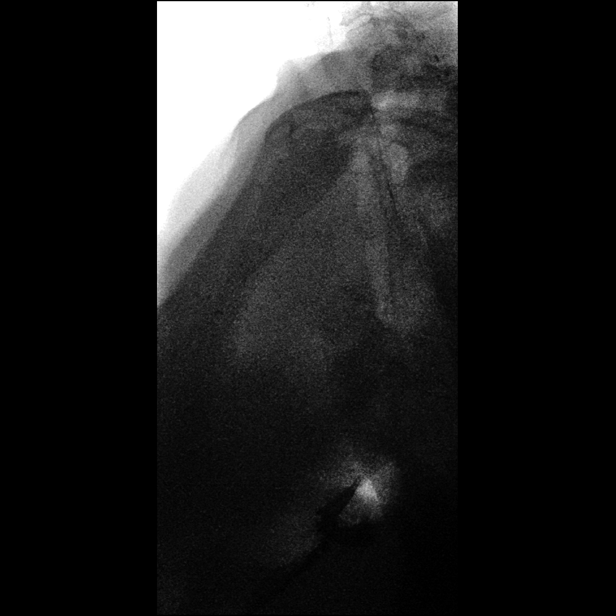

[Series 3: sequence · 2 of 30 frames shown (3 of 6)]
[frame 5/30]
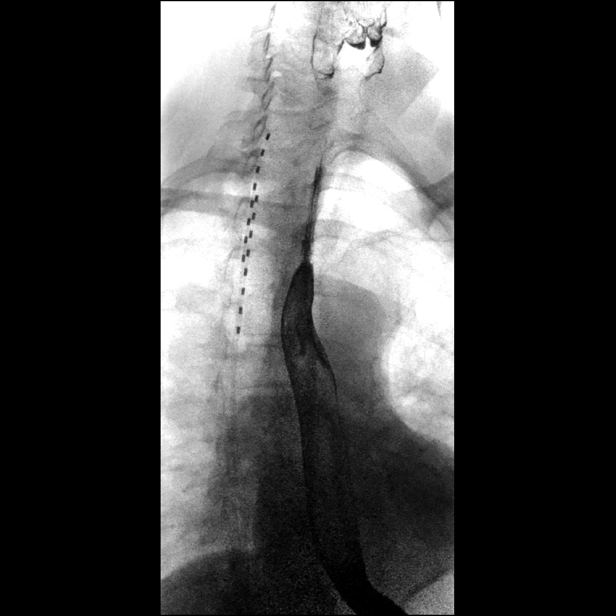
[frame 16/30]
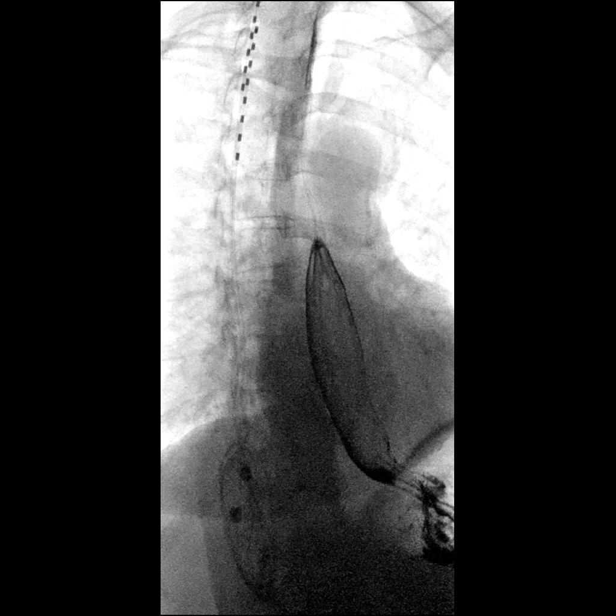

[Series 4: sequence · 2 of 48 frames shown (4 of 6)]
[frame 7/48]
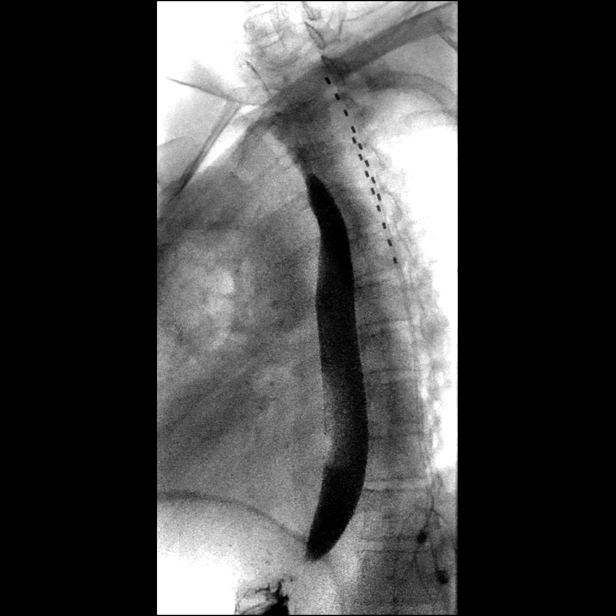
[frame 25/48]
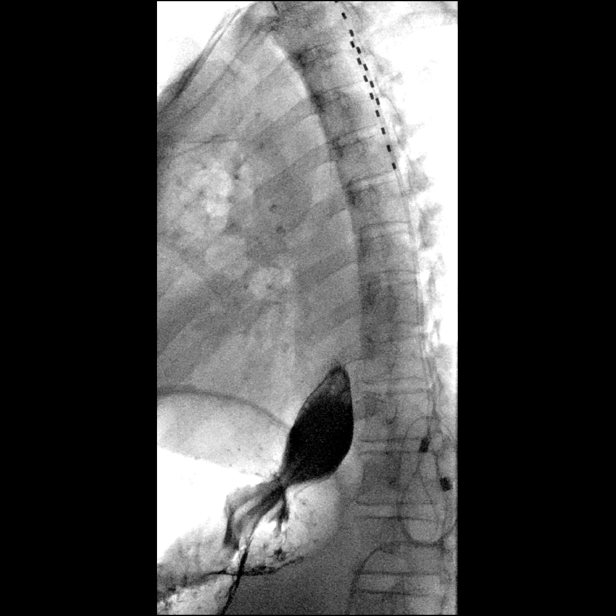

[Series 5: one shot · 1 of 2 slices shown (1 of 2)]
[im 1/2]
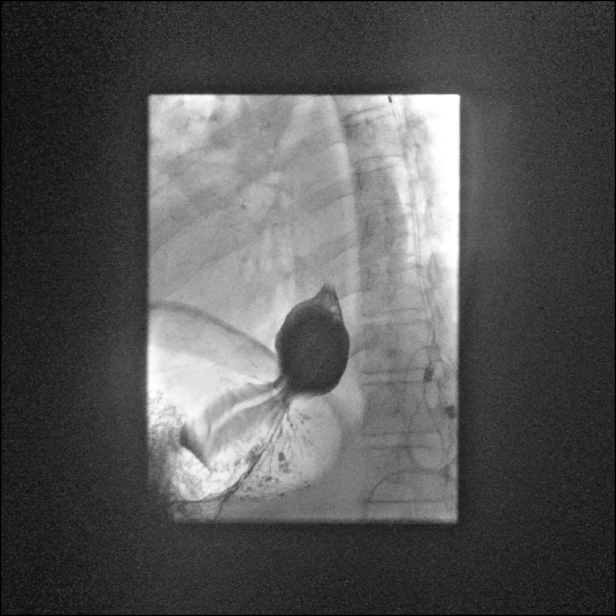

[Series 6: sequence · 2 of 6 frames shown (5 of 6)]
[frame 1/6]
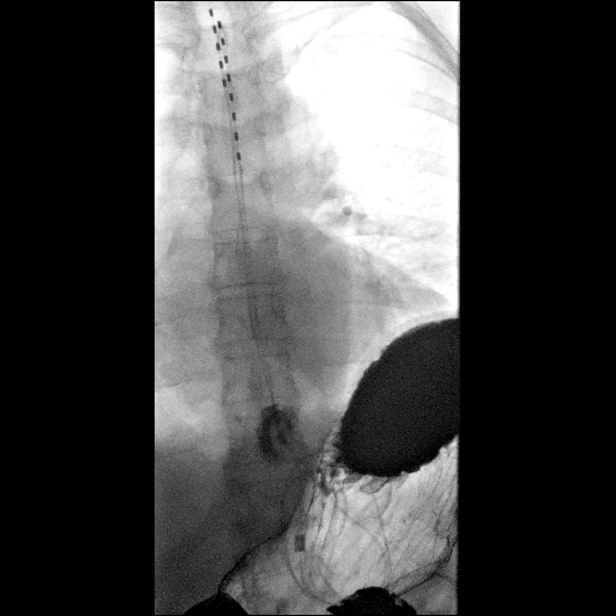
[frame 6/6]
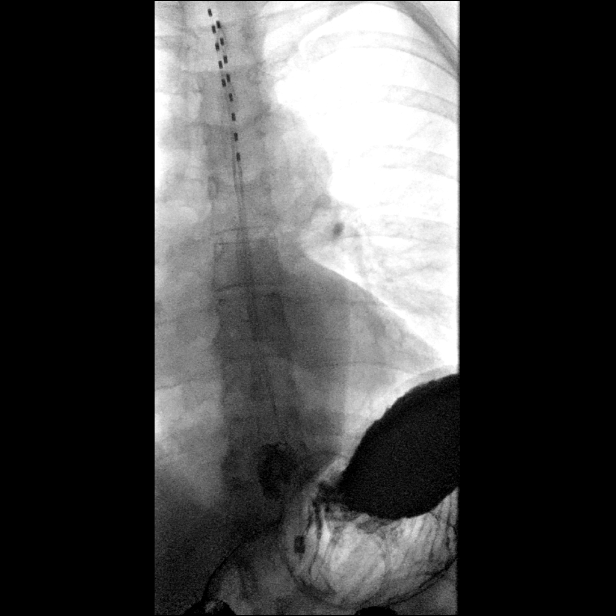

[Series 7: sequence · 2 of 14 frames shown (6 of 6)]
[frame 3/14]
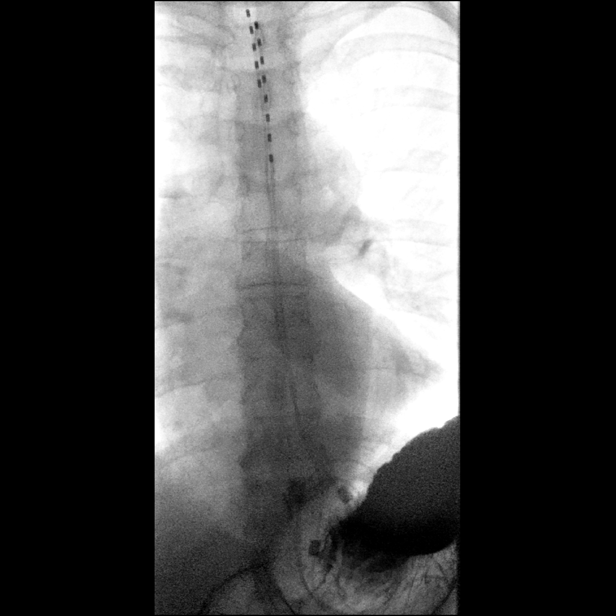
[frame 12/14]
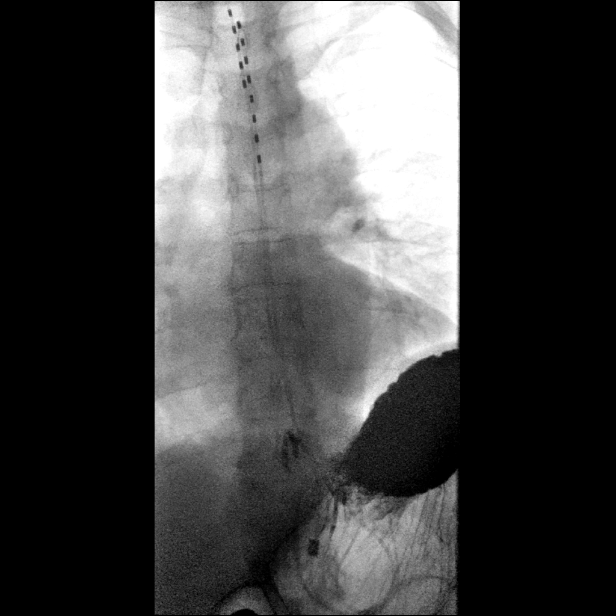

[Series 8: one shot · 1 of 2 slices shown (2 of 2)]
[im 2/2]
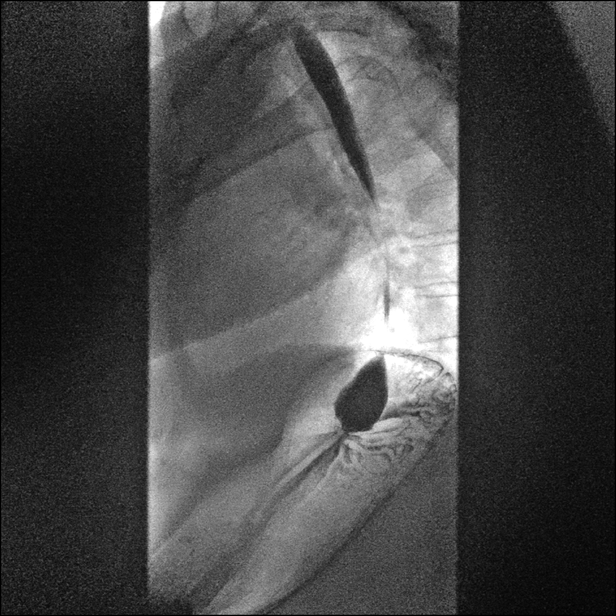

[14 of 24 positions shown; findings below may reference images not displayed]

FINDINGS: Swallowing: Appears normal. No vestibular penetration or aspiration
seen.

Pharynx: Unremarkable.

Esophagus: Normal motility, caliber, and mucosa. There is an
impression on the posterior esophagus at the level of C4-C5 from
osteophytic formation. This is non-obstructive to liquid barium and
13 mm barium tablet

Esophageal motility: Within normal limits.

Hiatal Hernia: Small-to-moderate sliding type.

Gastroesophageal reflux: Large volume reflux elicited above the
level of the carina.

Ingested 13mm barium tablet: Passed without delay.

Other: None.
IMPRESSION: 1. Small-to-moderate in size sliding type hiatal hernia with
associated gastroesophageal reflux.

2.  No findings suspicious for recurrent Barrett's esophagus.

3.  Incidentally noted cervical osteophytes.

## 2023-12-15 ENCOUNTER — Other Ambulatory Visit (HOSPITAL_COMMUNITY): Payer: Self-pay | Admitting: Gastroenterology

## 2023-12-15 DIAGNOSIS — R110 Nausea: Secondary | ICD-10-CM

## 2023-12-18 ENCOUNTER — Encounter (HOSPITAL_COMMUNITY): Payer: Self-pay

## 2023-12-18 ENCOUNTER — Ambulatory Visit (HOSPITAL_COMMUNITY)
Admission: RE | Admit: 2023-12-18 | Discharge: 2023-12-18 | Disposition: A | Discharge status: Home / Self Care | Source: Ambulatory Visit | Attending: Gastroenterology | Admitting: Gastroenterology

## 2023-12-18 DIAGNOSIS — R110 Nausea: Secondary | ICD-10-CM | POA: Diagnosis present

## 2023-12-18 MED ORDER — TECHNETIUM TC 99M SULFUR COLLOID
2.0300 | Freq: Once | INTRAVENOUS | Status: AC
  Administered 2023-12-18: 2.03 via ORAL
